# Patient Record
Sex: Male | Born: 1970 | Race: White | Hispanic: No | Marital: Married | Smoking: Never smoker
Health system: Southern US, Community
[De-identification: ages and names within clinical notes are randomized; demographics above are authoritative.]

## PROBLEM LIST (undated history)

## (undated) DIAGNOSIS — K219 Gastro-esophageal reflux disease without esophagitis: Secondary | ICD-10-CM

## (undated) DIAGNOSIS — M5020 Other cervical disc displacement, unspecified cervical region: Secondary | ICD-10-CM

## (undated) DIAGNOSIS — K4020 Bilateral inguinal hernia, without obstruction or gangrene, not specified as recurrent: Secondary | ICD-10-CM

## (undated) DIAGNOSIS — G4730 Sleep apnea, unspecified: Secondary | ICD-10-CM

## (undated) DIAGNOSIS — G4733 Obstructive sleep apnea (adult) (pediatric): Principal | ICD-10-CM

## (undated) DIAGNOSIS — T7840XA Allergy, unspecified, initial encounter: Secondary | ICD-10-CM

## (undated) DIAGNOSIS — M722 Plantar fascial fibromatosis: Secondary | ICD-10-CM

## (undated) HISTORY — DX: Gastro-esophageal reflux disease without esophagitis: K21.9

## (undated) HISTORY — DX: Other cervical disc displacement, unspecified cervical region: M50.20

## (undated) HISTORY — DX: Obstructive sleep apnea (adult) (pediatric): G47.33

## (undated) HISTORY — PX: WISDOM TOOTH EXTRACTION: SHX21

## (undated) HISTORY — DX: Bilateral inguinal hernia, without obstruction or gangrene, not specified as recurrent: K40.20

## (undated) HISTORY — DX: Allergy, unspecified, initial encounter: T78.40XA

## (undated) HISTORY — DX: Plantar fascial fibromatosis: M72.2

## (undated) HISTORY — DX: Sleep apnea, unspecified: G47.30

---

## 2000-09-15 ENCOUNTER — Encounter: Payer: Self-pay | Admitting: Emergency Medicine

## 2000-09-15 ENCOUNTER — Emergency Department (HOSPITAL_COMMUNITY): Admission: EM | Admit: 2000-09-15 | Discharge: 2000-09-15 | Payer: Self-pay | Admitting: Emergency Medicine

## 2002-09-13 HISTORY — PX: VASECTOMY: SHX75

## 2011-05-20 ENCOUNTER — Emergency Department (HOSPITAL_COMMUNITY)
Admission: EM | Admit: 2011-05-20 | Discharge: 2011-05-20 | Disposition: A | Discharge status: Home / Self Care | Payer: BC Managed Care – PPO | Attending: Emergency Medicine | Admitting: Emergency Medicine

## 2011-05-20 ENCOUNTER — Emergency Department (HOSPITAL_COMMUNITY): Payer: BC Managed Care – PPO

## 2011-05-20 DIAGNOSIS — M940 Chondrocostal junction syndrome [Tietze]: Secondary | ICD-10-CM | POA: Insufficient documentation

## 2011-05-20 DIAGNOSIS — R071 Chest pain on breathing: Secondary | ICD-10-CM | POA: Insufficient documentation

## 2011-05-20 LAB — D-DIMER, QUANTITATIVE: D-Dimer, Quant: 0.22 ug/mL-FEU (ref 0.00–0.48)

## 2011-12-05 ENCOUNTER — Ambulatory Visit (INDEPENDENT_AMBULATORY_CARE_PROVIDER_SITE_OTHER): Payer: BC Managed Care – PPO | Admitting: Family Medicine

## 2011-12-05 VITALS — BP 145/72 | HR 68 | Temp 98.1°F | Resp 16 | Ht 74.0 in | Wt 220.0 lb

## 2011-12-05 DIAGNOSIS — J40 Bronchitis, not specified as acute or chronic: Secondary | ICD-10-CM

## 2011-12-05 MED ORDER — PREDNISONE 20 MG PO TABS: ORAL_TABLET | ORAL | Status: AC

## 2011-12-05 MED ORDER — HYDROCODONE-HOMATROPINE 5-1.5 MG/5ML PO SYRP: 5.0000 mL | ORAL_SOLUTION | Freq: Three times a day (TID) | ORAL | Status: AC | PRN

## 2011-12-05 NOTE — Progress Notes (Signed)
  Subjective:    Patient ID: James Burton, male    DOB: 1970/11/26, 41 y.o.   MRN: [id number]  HPI 41 yo male with URI symptoms.  Started with sore throat and head congestion.  Now cough remaining.  Wheezing.  "Sounds junky".  No fevers, slight sob at work (physical job).Cough has turned dry. Tried zyrtec, delsym.  Cough doesn't keep him awake (keeps wife awake)    Review of Systems     Objective:   Physical Exam  Constitutional: He appears well-developed. No distress.  HENT:  Right Ear: Tympanic membrane, external ear and ear canal normal. Tympanic membrane is not injected, not scarred, not perforated, not erythematous, not retracted and not bulging.  Left Ear: Tympanic membrane, external ear and ear canal normal. Tympanic membrane is not injected, not scarred, not perforated, not erythematous, not retracted and not bulging.  Nose: No mucosal edema or rhinorrhea. Right sinus exhibits no maxillary sinus tenderness and no frontal sinus tenderness. Left sinus exhibits no maxillary sinus tenderness and no frontal sinus tenderness.  Mouth/Throat: Uvula is midline, oropharynx is clear and moist and mucous membranes are normal. No oropharyngeal exudate or tonsillar abscesses.  Cardiovascular: Normal rate, regular rhythm, normal heart sounds and intact distal pulses.   No murmur heard. Pulmonary/Chest: Effort normal and breath sounds normal. No respiratory distress. He has no wheezes. He has no rales.  Lymphadenopathy:       Head (right side): No submandibular and no preauricular adenopathy present.       Head (left side): No submandibular and no preauricular adenopathy present.       Right cervical: No superficial cervical and no posterior cervical adenopathy present.      Left cervical: No superficial cervical and no posterior cervical adenopathy present.       Right: No supraclavicular adenopathy present.       Left: No supraclavicular adenopathy present.  Skin: Skin is warm and dry.           Assessment & Plan:  Post-viral bronchitis - prednisone, hycodan

## 2014-02-14 ENCOUNTER — Ambulatory Visit: Payer: BC Managed Care – PPO | Admitting: Family

## 2014-02-22 ENCOUNTER — Ambulatory Visit (INDEPENDENT_AMBULATORY_CARE_PROVIDER_SITE_OTHER): Payer: BC Managed Care – PPO | Admitting: Family

## 2014-02-22 ENCOUNTER — Encounter: Payer: Self-pay | Admitting: Family

## 2014-02-22 VITALS — BP 110/70 | HR 66 | Temp 97.8°F | Resp 12 | Ht 74.0 in | Wt 225.0 lb

## 2014-02-22 DIAGNOSIS — R05 Cough: Secondary | ICD-10-CM

## 2014-02-22 DIAGNOSIS — R002 Palpitations: Secondary | ICD-10-CM

## 2014-02-22 DIAGNOSIS — R059 Cough, unspecified: Secondary | ICD-10-CM

## 2014-02-22 DIAGNOSIS — R0602 Shortness of breath: Secondary | ICD-10-CM

## 2014-02-22 MED ORDER — BUDESONIDE-FORMOTEROL FUMARATE 80-4.5 MCG/ACT IN AERO: 2.0000 | INHALATION_SPRAY | Freq: Two times a day (BID) | RESPIRATORY_TRACT | Status: DC

## 2014-02-22 MED ORDER — ALBUTEROL SULFATE (2.5 MG/3ML) 0.083% IN NEBU
2.5000 mg | INHALATION_SOLUTION | Freq: Once | RESPIRATORY_TRACT | Status: AC
  Administered 2014-02-22: 2.5 mg via RESPIRATORY_TRACT

## 2014-02-22 MED ORDER — FLUTICASONE PROPIONATE 50 MCG/ACT NA SUSP: 2.0000 | Freq: Every day | NASAL | Status: DC

## 2014-02-22 NOTE — Addendum Note (Signed)
Addended by: Kelle Darting A on: 02/22/2014 03:50 PM   Modules accepted: Orders

## 2014-02-22 NOTE — Patient Instructions (Signed)
Start Symbicort (download rx from [email or web address]). Continue zyrtec daily, add flonase (OTC) and generic prilosec (otc) once daily. Follow up in 1 month for complete physical.  Welcome to [facility]!

## 2014-02-22 NOTE — Progress Notes (Signed)
Subjective:    Patient ID: James Burton, male    DOB: September 15, 1970, 43 y.o.   MRN: [id number]  HPI  James Burton is a 43 yr old male who presents today to establish care. His chief complaint today is trouble taking a deep breath x 1 year. Reports that he has had a dry cough for 2 months.  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  He reports that his youngest son has asthma. He has not been diagnosed with asthma. Reports that he sometimes has associated nighttime wheezing.  Also complains of occasional heartburn symptoms.  Occasional nasal drip.  Cough is generally dry.  Reports that he has tried his son's inhaler twice with minimal improvement.  Symptoms lightly worse with exertion.    Reports bilateral inguinal hernias-  Does not cause him pain.  Plantar fasciitis- has had x 2 years. Reports that he has done therapy x 8 months, aleve, massage- almost resolved.   Review of Systems  Constitutional: Negative for fever and unexpected weight change.  Respiratory: Positive for cough.   Cardiovascular: Positive for palpitations. Negative for chest pain.  Gastrointestinal: Negative for nausea, vomiting, diarrhea and constipation.  Musculoskeletal: Negative for arthralgias and myalgias.  Skin: Negative for rash.  Neurological: Negative for headaches.  Hematological: Negative for adenopathy.  Psychiatric/Behavioral:       Denies depression/anxiety   History reviewed. No pertinent past medical history.  History   Social History  . Marital Status: Married    Spouse Name: N/A    Number of Children: N/A  . Years of Education: N/A   Occupational History  . Not on file.   Social History Main Topics  . Smoking status: Never Smoker   . Smokeless tobacco: Never Used  . Alcohol Use: No  . Drug Use: Not on file  . Sexual Activity: Not on file   Other Topics Concern  . Not on file   Social History Narrative   2 boys- ages 40 and 15   Married   Enjoys Environmental health practitioner   Completed 4 yrs of Media planner schooling   Works as Facilities manager    Past Surgical History  Procedure Laterality Date  . Vasectomy  2004    Family History  Problem Relation Age of Onset  . Diabetes Father   . Cancer Maternal Grandfather     bone, skin    Allergies  Allergen Reactions  . Diphenhydramine-Phenylephrine     No current outpatient prescriptions on file prior to visit.   No current facility-administered medications on file prior to visit.    BP 110/70  Pulse 66  Temp(Src) 97.8 F (36.6 C) (Oral)  Resp 12  Ht 6\' 2"  (1.88 m)  Wt 225 lb (102.059 kg)  BMI 28.88 kg/m2  SpO2 99%       Objective:   Physical Exam  Constitutional: He is oriented to person, place, and time. He appears well-developed and well-nourished. No distress.  HENT:  Head: Normocephalic and atraumatic.  Right Ear: Tympanic membrane and ear canal normal.  Left Ear: Tympanic membrane and ear canal normal.  Cardiovascular: Normal rate and regular rhythm.   No murmur heard. Pulmonary/Chest: Effort normal and breath sounds normal. No respiratory distress. He has no wheezes. He has no rales. He exhibits no tenderness.  Musculoskeletal: He exhibits no edema.  Neurological: He is alert and oriented to person, place, and time.  Skin: Skin is warm and dry.  Psychiatric: He has a  normal mood and affect. His behavior is normal. Judgment and thought content normal.          Assessment & Plan:

## 2014-02-22 NOTE — Progress Notes (Signed)
Pre visit review using our clinic review tool, if applicable. No additional management support is needed unless otherwise documented below in the visit note. 

## 2014-02-22 NOTE — Assessment & Plan Note (Addendum)
EKG is performed today and notes NSR without acute changes. Pt notes + dry cough.  I suspect symptoms may be secondary to asthma variant.  PFT's were performed today however and should good pulmonary function both pre and post bronchodilator.  Start Symbicort (download rx from [email or web address]). Continue zyrtec daily, add flonase (OTC) and generic prilosec (otc) once daily. Follow up in 1 month.

## 2014-03-19 ENCOUNTER — Other Ambulatory Visit: Payer: Self-pay | Admitting: Family

## 2014-03-19 ENCOUNTER — Ambulatory Visit (INDEPENDENT_AMBULATORY_CARE_PROVIDER_SITE_OTHER): Payer: BC Managed Care – PPO | Admitting: Family

## 2014-03-19 ENCOUNTER — Encounter: Payer: Self-pay | Admitting: Family

## 2014-03-19 VITALS — BP 120/80 | HR 65 | Temp 98.1°F | Resp 16 | Ht 74.0 in | Wt 228.1 lb

## 2014-03-19 DIAGNOSIS — R059 Cough, unspecified: Secondary | ICD-10-CM

## 2014-03-19 DIAGNOSIS — Z0000 Encounter for general adult medical examination without abnormal findings: Secondary | ICD-10-CM

## 2014-03-19 DIAGNOSIS — R05 Cough: Secondary | ICD-10-CM

## 2014-03-19 LAB — LIPID PANEL
Cholesterol: 176 mg/dL (ref 0–200)
HDL: 32 mg/dL — AB (ref >39)
LDL CALC: 122 mg/dL — AB (ref 0–99)
TRIGLYCERIDES: 109 mg/dL (ref <150)
Total CHOL/HDL Ratio: 5.5 Ratio
VLDL: 22 mg/dL (ref 0–40)

## 2014-03-19 LAB — HEPATIC FUNCTION PANEL
ALK PHOS: 97 U/L (ref 39–117)
ALT: 46 U/L (ref 0–53)
AST: 29 U/L (ref 0–37)
Albumin: 4.8 g/dL (ref 3.5–5.2)
BILIRUBIN DIRECT: 0.1 mg/dL (ref 0.0–0.3)
Indirect Bilirubin: 0.4 mg/dL (ref 0.2–1.2)
Total Bilirubin: 0.5 mg/dL (ref 0.2–1.2)
Total Protein: 6.9 g/dL (ref 6.0–8.3)

## 2014-03-19 LAB — CBC WITH DIFFERENTIAL/PLATELET
BASOS PCT: 0 % (ref 0–1)
Basophils Absolute: 0 10*3/uL (ref 0.0–0.1)
EOS ABS: 0.1 10*3/uL (ref 0.0–0.7)
Eosinophils Relative: 3 % (ref 0–5)
HCT: 42.7 % (ref 39.0–52.0)
Hemoglobin: 15.4 g/dL (ref 13.0–17.0)
Lymphocytes Relative: 18 % (ref 12–46)
Lymphs Abs: 0.8 10*3/uL (ref 0.7–4.0)
MCH: 29.5 pg (ref 26.0–34.0)
MCHC: 36.1 g/dL — ABNORMAL HIGH (ref 30.0–36.0)
MCV: 81.8 fL (ref 78.0–100.0)
MONOS PCT: 11 % (ref 3–12)
Monocytes Absolute: 0.5 10*3/uL (ref 0.1–1.0)
NEUTROS PCT: 68 % (ref 43–77)
Neutro Abs: 3.1 10*3/uL (ref 1.7–7.7)
PLATELETS: 172 10*3/uL (ref 150–400)
RBC: 5.22 MIL/uL (ref 4.22–5.81)
RDW: 12.9 % (ref 11.5–15.5)
WBC: 4.6 10*3/uL (ref 4.0–10.5)

## 2014-03-19 LAB — BASIC METABOLIC PANEL WITH GFR
BUN: 19 mg/dL (ref 6–23)
CALCIUM: 9.5 mg/dL (ref 8.4–10.5)
CO2: 28 mEq/L (ref 19–32)
Chloride: 104 mEq/L (ref 96–112)
Creat: 1.18 mg/dL (ref 0.50–1.35)
GFR, EST AFRICAN AMERICAN: 87 mL/min
GFR, Est Non African American: 75 mL/min
Glucose, Bld: 103 mg/dL — ABNORMAL HIGH (ref 70–99)
Potassium: 4.6 mEq/L (ref 3.5–5.3)
Sodium: 140 mEq/L (ref 135–145)

## 2014-03-19 LAB — TSH: TSH: 1.782 u[IU]/mL (ref 0.350–4.500)

## 2014-03-19 NOTE — Progress Notes (Signed)
Subjective:    Patient ID: James Burton, male    DOB: 03-28-1971, 43 y.o.   MRN: [id number]  HPI  James Burton is a 43 yr old male who presents today for complete physical.  Immunizations: tetanus up to date Diet: reports diet is fair.  Body mass index is 29.28 kg/(m^2). Exercise: no formal exercise Dental: due  Reports that his cough is much improved.   Review of Systems  Constitutional: Negative for unexpected weight change.  HENT: Negative for rhinorrhea.   Eyes: Negative for visual disturbance.  Respiratory: Negative for cough and shortness of breath.   Cardiovascular: Negative for chest pain.  Gastrointestinal: Negative for nausea and vomiting.  Genitourinary: Negative for dysuria and frequency.  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  Psychiatric/Behavioral:       Denies depression or anxiety   Past Medical History  Diagnosis Date  . Bilateral inguinal hernia   . Plantar fasciitis     History   Social History  . Marital Status: Married    Spouse Name: N/A    Number of Children: N/A  . Years of Education: N/A   Occupational History  . Not on file.   Social History Main Topics  . Smoking status: Never Smoker   . Smokeless tobacco: Never Used  . Alcohol Use: No  . Drug Use: Not on file  . Sexual Activity: Not on file   Other Topics Concern  . Not on file   Social History Narrative   2 boys- ages 35 and 8   Married   Enjoys Designer, fashion/clothing   Completed 4 yrs of Media planner schooling   Works as Facilities manager    Past Surgical History  Procedure Laterality Date  . Vasectomy  2004    Family History  Problem Relation Age of Onset  . Diabetes James Burton   . Cancer Maternal Grandfather     bone, skin    Allergies  Allergen Reactions  . Diphenhydramine-Phenylephrine     Rash on palms    Current Outpatient Prescriptions on File Prior to Visit  Medication Sig Dispense Refill  .  budesonide-formoterol (SYMBICORT) 80-4.5 MCG/ACT inhaler Inhale 2 puffs into the lungs 2 (two) times daily.  1 Inhaler  3  . cetirizine (ZYRTEC) 10 MG tablet Take 10 mg by mouth daily as needed for allergies.      . naproxen sodium (ANAPROX) 220 MG tablet Take 220 mg by mouth 2 (two) times daily as needed.      Marland Kitchen omeprazole (PRILOSEC OTC) 20 MG tablet Take 20 mg by mouth daily.       No current facility-administered medications on file prior to visit.    BP 120/80  Pulse 65  Temp(Src) 98.1 F (36.7 C) (Oral)  Resp 16  Ht 6\' 2"  (1.88 m)  Wt 228 lb 1.9 oz (103.475 kg)  BMI 29.28 kg/m2  SpO2 96%       Objective:   Physical Exam Physical Exam  Constitutional: He is oriented to person, place, and time. He appears well-developed and well-nourished. No distress.  HENT:  Head: Normocephalic and atraumatic.  Right Ear: Tympanic membrane and ear canal normal.  Left Ear: Tympanic membrane and ear canal normal.  Mouth/Throat: Oropharynx is clear and moist.  Eyes: Pupils are equal, round, and reactive to light. No scleral icterus.  Neck: Normal range of motion. No thyromegaly present.  Cardiovascular: Normal rate and regular rhythm.  No murmur heard. Pulmonary/Chest: Effort normal and breath sounds normal. No respiratory distress. He has no wheezes. He has no rales. He exhibits no tenderness.  Abdominal: Soft. Bowel sounds are normal. He exhibits no distension and no mass. There is no tenderness. There is no rebound and no guarding.  Musculoskeletal: He exhibits no edema.  Lymphadenopathy:    He has no cervical adenopathy.  Neurological: He is alert and oriented to person, place, and time. He has normal reflexes. He exhibits normal muscle tone. Coordination normal.  Skin: Skin is warm and dry.  Psychiatric: He has a normal mood and affect. His behavior is normal. Judgment and thought content normal.          Assessment & Plan:          Assessment & Plan:

## 2014-03-19 NOTE — Assessment & Plan Note (Signed)
Improved.  He prefers nasonex over flonase.  Continue nasal steroid, PPI, zyrtec, symbicort.

## 2014-03-19 NOTE — Patient Instructions (Signed)
Schedule an eye exam and dental. Try to 30 minutes of cardio 5 days a week. Limit sodas. Complete lab work prior to leaving.  Please schedule a follow up appointment in 6 months.

## 2014-03-19 NOTE — Assessment & Plan Note (Signed)
Discussed healthy diet, exercise.  Obtain fasting lab work. Advised pt to arrange dental and eye exam.

## 2014-03-19 NOTE — Progress Notes (Signed)
Pre visit review using our clinic review tool, if applicable. No additional management support is needed unless otherwise documented below in the visit note. 

## 2014-03-20 ENCOUNTER — Encounter: Payer: Self-pay | Admitting: Family

## 2014-03-20 LAB — URINALYSIS, ROUTINE W REFLEX MICROSCOPIC
BILIRUBIN URINE: NEGATIVE
Glucose, UA: NEGATIVE mg/dL
Hgb urine dipstick: NEGATIVE
KETONES UR: NEGATIVE mg/dL
Leukocytes, UA: NEGATIVE
Nitrite: NEGATIVE
PROTEIN: NEGATIVE mg/dL
Specific Gravity, Urine: 1.024 (ref 1.005–1.030)
UROBILINOGEN UA: 0.2 mg/dL (ref 0.0–1.0)
pH: 5.5 (ref 5.0–8.0)

## 2014-03-20 LAB — URINALYSIS, MICROSCOPIC ONLY
BACTERIA UA: NONE SEEN
Casts: NONE SEEN
Crystals: NONE SEEN
Squamous Epithelial / LPF: NONE SEEN

## 2014-03-21 LAB — HEMOGLOBIN A1C
Hgb A1c MFr Bld: 5.4 % (ref <5.7)
MEAN PLASMA GLUCOSE: 108 mg/dL (ref <117)

## 2014-03-22 ENCOUNTER — Encounter: Payer: Self-pay | Admitting: Family

## 2014-06-28 ENCOUNTER — Other Ambulatory Visit: Payer: Self-pay

## 2014-09-25 ENCOUNTER — Ambulatory Visit (INDEPENDENT_AMBULATORY_CARE_PROVIDER_SITE_OTHER): Payer: BLUE CROSS/BLUE SHIELD | Admitting: Family

## 2014-09-25 ENCOUNTER — Encounter: Payer: Self-pay | Admitting: Family

## 2014-09-25 VITALS — BP 90/56 | HR 86 | Temp 97.9°F | Resp 16 | Ht 74.0 in | Wt 231.0 lb

## 2014-09-25 DIAGNOSIS — Z23 Encounter for immunization: Secondary | ICD-10-CM

## 2014-09-25 DIAGNOSIS — R0683 Snoring: Secondary | ICD-10-CM

## 2014-09-25 DIAGNOSIS — G4733 Obstructive sleep apnea (adult) (pediatric): Secondary | ICD-10-CM | POA: Insufficient documentation

## 2014-09-25 DIAGNOSIS — R05 Cough: Secondary | ICD-10-CM

## 2014-09-25 DIAGNOSIS — R059 Cough, unspecified: Secondary | ICD-10-CM

## 2014-09-25 HISTORY — DX: Obstructive sleep apnea (adult) (pediatric): G47.33

## 2014-09-25 NOTE — Assessment & Plan Note (Signed)
Improved, ok off meds.  Monitor.

## 2014-09-25 NOTE — Patient Instructions (Addendum)
You will be contacted about your home sleep study. Follow up after 03/20/15 for annual physical.

## 2014-09-25 NOTE — Progress Notes (Signed)
   Subjective:    Patient ID: James Burton, male    DOB: [birth year], 44 y.o.   MRN: [id number]  HPI  James Burton is a 44 yr old male who presents today for follow up.  He was last seen 6 months ago and complained of cough. He was started on symbicort back in June, continued on zyrtec and flonase was added.  He was also instructed to start generic prilosec.  He reports that his cough symptoms have resolved. He reports that he has stopped all meds and reports that he only coughs infrequently.   + snoring with witnessed apneas.  This is worse when he is congested.  Reports that he "tosses and turns" all night long.  Never dozes off unexpectedly.      Review of Systems See HPI  Past Medical History  Diagnosis Date  . Bilateral inguinal hernia   . Plantar fasciitis     History   Social History  . Marital Status: Married    Spouse Name: N/A    Number of Children: N/A  . Years of Education: N/A   Occupational History  . Not on file.   Social History Main Topics  . Smoking status: Never Smoker   . Smokeless tobacco: Never Used  . Alcohol Use: No  . Drug Use: Not on file  . Sexual Activity: Not on file   Other Topics Concern  . Not on file   Social History Narrative   2 boys- ages 70 and 10   Married   Enjoys Designer, fashion/clothing   Completed 4 yrs of Media planner schooling   Works as Facilities manager    Past Surgical History  Procedure Laterality Date  . Vasectomy  2004    Family History  Problem Relation Age of Onset  . Diabetes Father   . Cancer Maternal Grandfather     bone, skin    Allergies  Allergen Reactions  . Diphenhydramine-Phenylephrine     Rash on palms    No current outpatient prescriptions on file prior to visit.   No current facility-administered medications on file prior to visit.    Ht 6\' 2"  (1.88 m)  Wt 231 lb (104.781 kg)  BMI 29.65 kg/m2       Objective:   Physical Exam  Constitutional: He is oriented to person, place, and time. He  appears well-developed and well-nourished. No distress.  HENT:  Head: Normocephalic and atraumatic.  Mouth/Throat: No posterior oropharyngeal edema or posterior oropharyngeal erythema.  Cardiovascular: Normal rate and regular rhythm.   No murmur heard. Pulmonary/Chest: Effort normal and breath sounds normal. No respiratory distress. He has no wheezes. He has no rales. He exhibits no tenderness.  Neurological: He is alert and oriented to person, place, and time.  Psychiatric: He has a normal mood and affect. His behavior is normal. Judgment and thought content normal.          Assessment & Plan:

## 2014-09-25 NOTE — Assessment & Plan Note (Signed)
+   snoring, + witnessed apneas. Refer for sleep study.

## 2014-09-25 NOTE — Progress Notes (Signed)
Pre visit review using our clinic review tool, if applicable. No additional management support is needed unless otherwise documented below in the visit note. 

## 2014-11-11 DIAGNOSIS — G4733 Obstructive sleep apnea (adult) (pediatric): Secondary | ICD-10-CM

## 2014-11-14 DIAGNOSIS — G4733 Obstructive sleep apnea (adult) (pediatric): Secondary | ICD-10-CM

## 2014-11-15 ENCOUNTER — Other Ambulatory Visit: Payer: Self-pay | Admitting: *Deleted

## 2014-11-15 DIAGNOSIS — R0683 Snoring: Secondary | ICD-10-CM

## 2014-11-17 ENCOUNTER — Encounter: Payer: Self-pay | Admitting: Family

## 2014-11-17 ENCOUNTER — Telehealth: Payer: Self-pay | Admitting: Family

## 2014-11-17 DIAGNOSIS — G4733 Obstructive sleep apnea (adult) (pediatric): Secondary | ICD-10-CM

## 2014-11-17 NOTE — Telephone Encounter (Signed)
Please contact pt and let him know that his home sleep study shows moderate sleep apnea. I would like to arrange home cpap for him.  He should follow up in in the office 6 weeks after starting CPAP.

## 2014-11-18 NOTE — Telephone Encounter (Signed)
Notified pt. He voices understanding and is agreeable to proceed with CPAP.

## 2015-03-06 ENCOUNTER — Telehealth: Payer: Self-pay | Admitting: Family

## 2015-03-06 NOTE — Telephone Encounter (Signed)
Pre visit letter mailed

## 2015-03-07 ENCOUNTER — Encounter: Payer: Self-pay | Admitting: Physician Assistant

## 2015-03-07 ENCOUNTER — Encounter: Payer: Self-pay | Admitting: Family

## 2015-03-07 ENCOUNTER — Ambulatory Visit (INDEPENDENT_AMBULATORY_CARE_PROVIDER_SITE_OTHER): Payer: BLUE CROSS/BLUE SHIELD | Admitting: Physician Assistant

## 2015-03-07 VITALS — BP 121/73 | HR 57 | Temp 97.9°F | Ht 74.0 in | Wt 227.0 lb

## 2015-03-07 DIAGNOSIS — M542 Cervicalgia: Secondary | ICD-10-CM | POA: Insufficient documentation

## 2015-03-07 DIAGNOSIS — S161XXA Strain of muscle, fascia and tendon at neck level, initial encounter: Secondary | ICD-10-CM

## 2015-03-07 MED ORDER — METHOCARBAMOL 500 MG PO TABS: 500.0000 mg | ORAL_TABLET | Freq: Four times a day (QID) | ORAL | Status: DC

## 2015-03-07 MED ORDER — MELOXICAM 15 MG PO TABS: 15.0000 mg | ORAL_TABLET | Freq: Every day | ORAL | Status: DC

## 2015-03-07 NOTE — Progress Notes (Signed)
Pre visit review using our clinic review tool, if applicable. No additional management support is needed unless otherwise documented below in the visit note. 

## 2015-03-07 NOTE — Assessment & Plan Note (Signed)
Rx Mobic once daily. ROoaxin TID. No driving while on medication.  Supportive measures and good posture reviewed with patient.  Follow-up PRN if symptoms are not improving.

## 2015-03-07 NOTE — Progress Notes (Signed)
   Patient presents to clinic today c/o neck pain and soreness over the past week.  Denies trauma or injury.  Has previous history of muscle spasm.  Has been taking Advil with minimal relief.  Ice has helped.  Past Medical History  Diagnosis Date  . Bilateral inguinal hernia   . Plantar fasciitis   . OSA (obstructive sleep apnea) 09/25/2014    Moderate OSA per home study 3/16     No current outpatient prescriptions on file prior to visit.   No current facility-administered medications on file prior to visit.    Allergies  Allergen Reactions  . Diphenhydramine-Phenylephrine     Rash on palms    Family History  Problem Relation Age of Onset  . Diabetes Father   . Cancer Maternal Grandfather     bone, skin    History   Social History  . Marital Status: Married    Spouse Name: N/A  . Number of Children: N/A  . Years of Education: N/A   Social History Main Topics  . Smoking status: Never Smoker   . Smokeless tobacco: Never Used  . Alcohol Use: No  . Drug Use: Not on file  . Sexual Activity: Not on file   Other Topics Concern  . None   Social History Narrative   2 boys- ages 76 and 29   Married   Enjoys Designer, fashion/clothing   Completed 4 yrs of Media planner schooling   Works as Facilities manager   Review of Systems - See HPI.  All other ROS are negative.  BP 121/73 mmHg  Pulse 57  Temp(Src) 97.9 F (36.6 C) (Oral)  Ht 6\' 2"  (1.88 m)  Wt 227 lb (102.967 kg)  BMI 29.13 kg/m2  SpO2 100%  Physical Exam  Constitutional: He is oriented to person, place, and time and well-developed, well-nourished, and in no distress.  HENT:  Head: Normocephalic and atraumatic.  Neck: Normal range of motion. Neck supple. Muscular tenderness present. No spinous process tenderness present.  Cardiovascular: Normal rate, regular rhythm, normal heart sounds and intact distal pulses.   Pulmonary/Chest: Effort normal and breath sounds normal.  Neurological: He is alert and oriented to person,  place, and time.  Skin: Skin is warm and dry. No rash noted.  Psychiatric: Affect normal.  Vitals reviewed.  Assessment/Plan: Neck muscle strain Rx Mobic once daily. ROoaxin TID. No driving while on medication.  Supportive measures and good posture reviewed with patient.  Follow-up PRN if symptoms are not improving.

## 2015-03-07 NOTE — Patient Instructions (Signed)
Please take the Meloxicam daily as directed.  Use Tylenol for breakthrough pain. Take the Robaxin up to three times daily. Do not drive while on medication. Alternate ice and heat to the area. Avoid heavy lifting or overexertion. Follow-up if symptoms are not resolving.

## 2015-03-26 ENCOUNTER — Encounter: Payer: BLUE CROSS/BLUE SHIELD | Admitting: Family

## 2015-04-11 ENCOUNTER — Ambulatory Visit (INDEPENDENT_AMBULATORY_CARE_PROVIDER_SITE_OTHER): Payer: BLUE CROSS/BLUE SHIELD | Admitting: Family

## 2015-04-11 ENCOUNTER — Encounter: Payer: Self-pay | Admitting: Family

## 2015-04-11 VITALS — BP 122/74 | HR 56 | Temp 97.8°F | Resp 16 | Ht 74.0 in | Wt 225.0 lb

## 2015-04-11 DIAGNOSIS — M542 Cervicalgia: Secondary | ICD-10-CM

## 2015-04-11 MED ORDER — CYCLOBENZAPRINE HCL 5 MG PO TABS: 5.0000 mg | ORAL_TABLET | Freq: Three times a day (TID) | ORAL | Status: DC | PRN

## 2015-04-11 MED ORDER — METHYLPREDNISOLONE 4 MG PO TBPK: ORAL_TABLET | ORAL | Status: DC
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
## 2015-04-11 NOTE — Assessment & Plan Note (Signed)
Now with radiculopathy. Advised course of medrol dose pak, flexeril prn, MRI to further evaluate.

## 2015-04-11 NOTE — Patient Instructions (Signed)
You will be contacted about your MRI. Start medrol dose pak. You may use flexeril as needed. Let me know if your symptoms worsen or if not improved in 1 week.

## 2015-04-11 NOTE — Progress Notes (Signed)
Pre visit review using our clinic review tool, if applicable. No additional management support is needed unless otherwise documented below in the visit note. 

## 2015-04-12 ENCOUNTER — Ambulatory Visit (HOSPITAL_BASED_OUTPATIENT_CLINIC_OR_DEPARTMENT_OTHER)
Admission: RE | Admit: 2015-04-12 | Discharge: 2015-04-12 | Disposition: A | Discharge status: Home / Self Care | Payer: BLUE CROSS/BLUE SHIELD | Source: Ambulatory Visit | Attending: Family | Admitting: Family

## 2015-04-12 DIAGNOSIS — M542 Cervicalgia: Secondary | ICD-10-CM

## 2015-04-14 ENCOUNTER — Telehealth: Payer: Self-pay | Admitting: Family

## 2015-04-14 MED ORDER — TRAMADOL HCL 50 MG PO TABS: 50.0000 mg | ORAL_TABLET | Freq: Three times a day (TID) | ORAL | Status: DC | PRN

## 2015-04-14 NOTE — Telephone Encounter (Signed)
See rx for tramadol prn.  I would advise him to give the steroids a few more days. If his symptoms improve enough that he can reschedule MRI that would be be great. He can take muscle relaxer and tramadol prior to MRI.

## 2015-04-14 NOTE — Telephone Encounter (Signed)
Caller name: [name] Relationship to patient: self Can be reached: [phone] Pharmacy:  Reason for call: Pt went for MRI on Saturday. He was not able to remain flat and still to complete the MRI due to pain with his neck. They cancelled the MRI and advised him to contact PCP. He said that his neck pain is much worse when laying down and he is having trouble sleeping. Pt has a muscle relaxer and states it helps but he is still in a good deal of pain.

## 2015-04-14 NOTE — Telephone Encounter (Signed)
Notified pt. He voices understanding. Will try to schedule MRI as soon as his pain will allow. Advised him to let us know if pain is not improving or is worsening.

## 2015-04-15 ENCOUNTER — Telehealth: Payer: Self-pay | Admitting: Family

## 2015-04-15 MED ORDER — DIAZEPAM 5 MG PO TABS: ORAL_TABLET | ORAL | Status: DC

## 2015-04-15 NOTE — Telephone Encounter (Signed)
See prev phone note

## 2015-04-15 NOTE — Addendum Note (Signed)
Addended by: [name] on: 04/15/2015 12:11 PM   Modules accepted: Orders

## 2015-04-15 NOTE — Addendum Note (Signed)
Addended by: Kelle Darting A on: 04/15/2015 01:44 PM   Modules accepted: Orders

## 2015-04-15 NOTE — Telephone Encounter (Signed)
Please see rx pended below. I would advise that he have someone drive him home from MRI due to possible sedation from valium.

## 2015-04-15 NOTE — Telephone Encounter (Signed)
Rx called to [facility] at [facility] and pt has been notified.

## 2015-04-15 NOTE — Telephone Encounter (Signed)
Can be reached: [phone] Pharmacy: [facility] on [address]  Reason for call: Pt called and states he rescheduled MRI for 04/19/15. He said they advised him to contact us for valium to take before to get thru MRI this time.

## 2015-04-19 ENCOUNTER — Ambulatory Visit (HOSPITAL_BASED_OUTPATIENT_CLINIC_OR_DEPARTMENT_OTHER)
Admission: RE | Admit: 2015-04-19 | Discharge: 2015-04-19 | Disposition: A | Discharge status: Home / Self Care | Payer: BLUE CROSS/BLUE SHIELD | Source: Ambulatory Visit | Attending: Family | Admitting: Family

## 2015-04-19 DIAGNOSIS — M5022 Other cervical disc displacement, mid-cervical region: Secondary | ICD-10-CM | POA: Diagnosis not present

## 2015-04-19 DIAGNOSIS — G542 Cervical root disorders, not elsewhere classified: Secondary | ICD-10-CM | POA: Diagnosis not present

## 2015-04-19 DIAGNOSIS — M542 Cervicalgia: Secondary | ICD-10-CM | POA: Diagnosis present

## 2015-04-20 ENCOUNTER — Telehealth: Payer: Self-pay | Admitting: Family

## 2015-04-20 DIAGNOSIS — M5010 Cervical disc disorder with radiculopathy, unspecified cervical region: Secondary | ICD-10-CM

## 2015-04-20 MED ORDER — METHYLPREDNISOLONE 4 MG PO TBPK: ORAL_TABLET | ORAL | Status: DC

## 2015-04-20 NOTE — Telephone Encounter (Signed)
Reviewed MRI report. Contacted pt.  Left detailed message on pt's cell number advising him that I will arrange a neurosurgery consult and to repeat medrol dose pak.    Jen/Marj- could you please contact neuro surgery and let them know that I would like for them to him ASAP- let me know if you are not able to get pt in on Monday or Tuesday to be seen.

## 2015-04-21 NOTE — Telephone Encounter (Signed)
Records faxed over for review with MD on call/awaiting appt

## 2015-04-21 NOTE — Telephone Encounter (Signed)
Thank you :)

## 2015-04-21 NOTE — Telephone Encounter (Signed)
Pt is seeing Dr Joya Salm today @ 3

## 2015-04-24 ENCOUNTER — Other Ambulatory Visit: Payer: Self-pay | Admitting: Neurosurgery

## 2015-04-24 DIAGNOSIS — M5412 Radiculopathy, cervical region: Secondary | ICD-10-CM

## 2015-04-28 ENCOUNTER — Encounter: Payer: Self-pay | Admitting: Physical Therapy

## 2015-04-28 ENCOUNTER — Ambulatory Visit: Payer: BLUE CROSS/BLUE SHIELD | Attending: Neurosurgery | Admitting: Physical Therapy

## 2015-04-28 DIAGNOSIS — M792 Neuralgia and neuritis, unspecified: Secondary | ICD-10-CM | POA: Diagnosis present

## 2015-04-28 DIAGNOSIS — M542 Cervicalgia: Secondary | ICD-10-CM | POA: Diagnosis not present

## 2015-04-28 DIAGNOSIS — M436 Torticollis: Secondary | ICD-10-CM | POA: Insufficient documentation

## 2015-04-28 NOTE — Therapy (Signed)
[facility] Outpatient Rehabilitation [facility] [address]  [address] [place], [place], [address] Phone: [phone]   Fax:  [phone]  Physical Therapy Evaluation  Patient Details  Name: James Burton MRN: [id number] Date of Birth: [birth year] Referring Provider:  Hilda Lias, MD  Encounter Date: 04/28/2015      PT End of Session - 04/28/15 1538    Visit Number 1   Number of Visits 12   Date for PT Re-Evaluation 05/23/15   PT Start Time 1528   PT Stop Time 1637   PT Time Calculation (min) 69 min      Past Medical History  Diagnosis Date  . Bilateral inguinal hernia   . Plantar fasciitis   . OSA (obstructive sleep apnea) 09/25/2014    Moderate OSA per home study 3/16     Past Surgical History  Procedure Laterality Date  . Vasectomy  2004    There were no vitals filed for this visit.  Visit Diagnosis:  Neck pain  Radicular pain in left arm  Neck stiffness      Subjective Assessment - 04/28/15 1539    Subjective Pt with intense neck pain over the past 4-5 months.  He participated a couple chiropractic treatments but didn't seem to help.  Recent imaging reveals large disc bulges which include cord flattening.  Pt reports intermittent N/T in posterior L UE but states this has diminished lately and is currently only noted in L digits #1-2.  States most difficulty is with sleeping at night.  States notes L UE pain when attempting to sleep.   Currently in Pain? Yes   Pain Score --  pt rates AVG pain 3/10 lately and worst pain 7/10 in the past few days.   Pain Location Neck   Pain Orientation Left   Pain Descriptors / Indicators Aching;Sharp  pain increases to sharp with coughing, sneezing, etc   Pain Radiating Towards pain extends L neck and throughout L scapula and into L UE.   Pain Onset More than a month ago   Pain Frequency Intermittent   Aggravating Factors  prolonged static sitting, neck motion (especially looking up), reaching  overhead   Pain Relieving Factors ice, medication            [facility] PT Assessment - 04/28/15 0001    Assessment   Medical Diagnosis neck pain   Onset Date/Surgical Date 12/13/14   Hand Dominance Right   Balance Screen   Has the patient fallen in the past 6 months No   Has the patient had a decrease in activity level because of a fear of falling?  No   Is the patient reluctant to leave their home because of a fear of falling?  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   Leisure walks some for exercise, no other regular exercise (work is typically very physical)   Observation/Other Assessments   Focus on Therapeutic Outcomes (FOTO)  54% limitation   ROM / Strength   AROM / PROM / Strength AROM;Strength   AROM   AROM Assessment Site Cervical   Cervical Flexion WNL - mild pain   Cervical Extension 30  posterior neck and L UE pain   Cervical - Right Side Bend 27  tight without pain   Cervical - Left Side Bend 30  tight without pain   Cervical - Right  Rotation 48  L neck pain, L UE n/t   Cervical - Left Rotation 60  mild pull, no pain   Strength   Strength Assessment Site Hand   Right/Left hand Right;Left   Right Hand Grip (lbs) 1:53# , 2:122#, 3:120#, 4: 104#, 5:94#   Right Hand Lateral Pinch 21 lbs   Right Hand 3 Point Pinch 13 lbs   Left Hand Grip (lbs) 1:67# , 2:110#, 3:120# , 4: 105#, 5:87#   Left Hand Lateral Pinch 21 lbs   Left Hand 3 Point Pinch 10 lbs       TODAY'S TREATMENT Manual - t-spine PA mobes mid to upper t-spine grade 3.  Began to note L UE radicular pain with upper t-spine PA mobes so changed to manual distraction TherAct - instructed and practiced supine lying with towel roll in pillow case for proper neck support with sleeping (states felt good without radicular pain) Mechanical Traction - c-spine, 20dg pull, 10#/5#, 60"/20",  15'                   PT Education - 04/28/15 1630    Education provided Yes   Education Details posture correction, sleeping posture with towel roll in pillow case.   Person(s) Educated Patient   Methods Explanation;Demonstration   Comprehension Verbalized understanding;Returned demonstration             PT Long Term Goals - 04/28/15 1631    PT LONG TERM GOAL #1   Title pt independent with HEP as necessary for continued progress by 05/23/15   Status New   PT LONG TERM GOAL #2   Title pt reports neck pain no greater than 4/10 at worst and 2/10 on average by 05/23/15   Status New   PT LONG TERM GOAL #3   Title pt displays more upright posture (ear nearly over shoulders and shoulders over hips) by 05/23/15   Status New   PT LONG TERM GOAL #4   Title pt states able to sleep without limitation by neck pain by 05/23/15   Status New   PT LONG TERM GOAL #5   Title c-spine B rotation AROM to 60 dg or better by 05/23/15               Plan - 04/28/15 1623    Clinical Impression Statement Pt with neck pain, LOM, and L UE radicular pain along with N/T.  MRI indicates significant disc issues at C5/6 (right and central) and C6/7 (left and central).  Pt unknown MOI but with significant forward head and rounded shoulder posture.  He states he's been working on improving his posture lately and that will also be part of our POC here.  There is hypomobility noted in upper t-spine.  B grip strength equal at 120#.  Pt notes decreased pain with use of ice and since taking prednisone dose pack.  He is scheduled for epidural so am optimistic for further improvement.  Treatments here will focus on listed impairments along with mechanical traction and mild extension biased neck program.   Pt will benefit from skilled therapeutic intervention in order to improve on the following deficits Pain;Decreased mobility;Decreased range of motion;Hypomobility   Rehab Potential Good   PT Frequency 3x /  week  2-3x/wk   PT Duration 4 weeks   PT Treatment/Interventions Manual techniques;Therapeutic exercise;Therapeutic activities;Traction;Cryotherapy;Patient/family education;Taping;Dry needling   PT Next Visit Plan scapular retraction, pec stretch, mechanical traction (12# next treatment), manual for c-spine extension and t-spine mobility as  tolerated, UE nerve glides   Consulted and Agree with Plan of Care Patient         Problem List Patient Active Problem List   Diagnosis Date Noted  . Cervical pain (neck) 03/07/2015  . OSA (obstructive sleep apnea) 09/25/2014  . Routine general medical examination at a health care facility 03/19/2014  . Cough 02/22/2014    Chinmay Squier PT, OCS 04/28/2015, 4:42 PM  [facility] [address]  [address] [place], [place], [address] Phone: [phone]   Fax:  [phone]

## 2015-04-30 ENCOUNTER — Ambulatory Visit
Admission: RE | Admit: 2015-04-30 | Discharge: 2015-04-30 | Disposition: A | Discharge status: Home / Self Care | Payer: BLUE CROSS/BLUE SHIELD | Source: Ambulatory Visit | Attending: Neurosurgery | Admitting: Neurosurgery

## 2015-04-30 ENCOUNTER — Ambulatory Visit: Payer: BLUE CROSS/BLUE SHIELD | Admitting: Physical Therapy

## 2015-04-30 VITALS — BP 151/90 | HR 69

## 2015-04-30 DIAGNOSIS — M436 Torticollis: Secondary | ICD-10-CM

## 2015-04-30 DIAGNOSIS — M542 Cervicalgia: Secondary | ICD-10-CM

## 2015-04-30 DIAGNOSIS — M792 Neuralgia and neuritis, unspecified: Secondary | ICD-10-CM

## 2015-04-30 DIAGNOSIS — M5412 Radiculopathy, cervical region: Secondary | ICD-10-CM

## 2015-04-30 MED ORDER — DIAZEPAM 5 MG PO TABS
10.0000 mg | ORAL_TABLET | Freq: Once | ORAL | Status: AC
  Administered 2015-04-30: 10 mg via ORAL

## 2015-04-30 MED ORDER — TRIAMCINOLONE ACETONIDE 40 MG/ML IJ SUSP (RADIOLOGY)
60.0000 mg | Freq: Once | INTRAMUSCULAR | Status: AC
  Administered 2015-04-30: 60 mg via EPIDURAL

## 2015-04-30 MED ORDER — IOHEXOL 180 MG/ML  SOLN
1.0000 mL | Freq: Once | INTRAMUSCULAR | Status: DC | PRN
  Administered 2015-04-30: 1 mL via EPIDURAL

## 2015-04-30 NOTE — Discharge Instructions (Signed)

## 2015-04-30 NOTE — Therapy (Signed)
[facility] Outpatient Rehabilitation [facility] [address]  [address] [place], [place], [address] Phone: [phone]   Fax:  [phone]  Physical Therapy Treatment  Patient Details  Name: James Burton MRN: [id number] Date of Birth: Jun 29, 1971 Referring Provider:  Hilda Lias, MD  Encounter Date: 04/30/2015      PT End of Session - 04/30/15 0720    Visit Number 2   Number of Visits 12   Date for PT Re-Evaluation 05/23/15   PT Start Time 0715   PT Stop Time 0813   PT Time Calculation (min) 58 min      Past Medical History  Diagnosis Date  . Bilateral inguinal hernia   . Plantar fasciitis   . OSA (obstructive sleep apnea) 09/25/2014    Moderate OSA per home study 3/16     Past Surgical History  Procedure Laterality Date  . Vasectomy  2004    There were no vitals filed for this visit.  Visit Diagnosis:  Neck pain  Radicular pain in left arm  Neck stiffness      Subjective Assessment - 04/30/15 0717    Subjective pt to undergo c-spine epidural later today.  States did use towel roll in pillow case with benefit.  States is feeling pretty good so far this AM.   Currently in Pain? Yes   Pain Score 1    Pain Location Neck           TODAY'S TREATMENT TherEx - UBE lvl 2.0 1'/1' Foam Roll T-spine Extension rolling 60" Hooklying on Foam Roll - t-spine extension / Pec stretch 2'; Pullover 8# 15x, B ER with scap retraction Green TB 15x Seated c-spine Extension stretch/self mobe with hands stabilizing upper c-spine 10x5" (Added to HEP) Corner Pec Stretch with chin tuck 3x20" (Added to HEP) Corner Push out with chin tuck 10x5" (Added to HEP) Standing "W" with Green TB 15x (Added to HEP)  Mechanical Traction - c-spine, 20dg pull, 13#/7#, 60"/20", 15'            PT Education - 04/30/15 0755    Education provided Yes   Education Details HEP   Person(s) Educated Patient   Methods Explanation;Demonstration;Handout   Comprehension Verbalized understanding;Returned demonstration             PT Long Term Goals - 04/30/15 0757    PT LONG TERM GOAL #1   Title pt independent with HEP as necessary for continued progress by 05/23/15   Status On-going   PT LONG TERM GOAL #2   Title pt reports neck pain no greater than 4/10 at worst and 2/10 on average by 05/23/15   Status On-going   PT LONG TERM GOAL #3   Title pt displays more upright posture (ear nearly over shoulders and shoulders over hips) by 05/23/15   Status On-going   PT LONG TERM GOAL #4   Title pt states able to sleep without limitation by neck pain by 05/23/15   Status On-going               Plan - 04/30/15 0757    Clinical Impression Statement pt with some improvement in sleep with use of towel roll; he is working hard to improve posture; highly motivated.  Pt to undergo c-spine epidural later today so advised him to not perform HEP tomorrow.  Increased traction today.   PT Next Visit Plan scapular retraction, pec stretch, mechanical traction, manual for c-spine extension and t-spine mobility as tolerated, UE  nerve glides   Consulted and Agree with Plan of Care Patient        Problem List Patient Active Problem List   Diagnosis Date Noted  . Cervical pain (neck) 03/07/2015  . OSA (obstructive sleep apnea) 09/25/2014  . Routine general medical examination at a health care facility 03/19/2014  . Cough 02/22/2014    Maclovia Uher PT, OCS 04/30/2015, 8:11 AM  [facility] [address]  [address] [place], [place], [address] Phone: [phone]   Fax:  [phone]

## 2015-05-05 ENCOUNTER — Ambulatory Visit: Payer: BLUE CROSS/BLUE SHIELD | Admitting: Physical Therapy

## 2015-05-05 DIAGNOSIS — M542 Cervicalgia: Secondary | ICD-10-CM | POA: Diagnosis not present

## 2015-05-05 DIAGNOSIS — M792 Neuralgia and neuritis, unspecified: Secondary | ICD-10-CM

## 2015-05-05 DIAGNOSIS — M436 Torticollis: Secondary | ICD-10-CM

## 2015-05-05 NOTE — Therapy (Signed)
[facility] [address]  [address] [place], [place], [address] Phone: [phone]   Fax:  [phone]  Physical Therapy Treatment  Patient Details  Name: James Burton MRN: [id number] Date of Birth: [birth year] Referring Provider:  Hilda Lias, MD  Encounter Date: 05/05/2015      PT End of Session - 05/05/15 0721    Visit Number 3   Number of Visits 12   Date for PT Re-Evaluation 05/23/15   PT Start Time 0716   PT Stop Time 0801   PT Time Calculation (min) 45 min      Past Medical History  Diagnosis Date  . Bilateral inguinal hernia   . Plantar fasciitis   . OSA (obstructive sleep apnea) 09/25/2014    Moderate OSA per home study 3/16     Past Surgical History  Procedure Laterality Date  . Vasectomy  2004    There were no vitals filed for this visit.  Visit Diagnosis:  Neck pain  Radicular pain in left arm  Neck stiffness      Subjective Assessment - 05/05/15 0717    Subjective pt states underwent epidural on 04/30/15 and states has been pain-free since 05/01/15.  He states he continues to note n/t in L UE (elbow to #1-2 fingers) but states is less intense. States has been performing HEP.   Currently in Pain? No/denies      TODAY'S TREATMENT TherEx - UBE lvl 3.0 90"/90" Foam Roll T-spine Extension rolling 60" Hooklying on Foam Roll - t-spine extension / Pec stretch 2'; Pullover 10# 15x, B ER with scap retraction Blue TB 15x, UE Flex with Contra Ext Blue TB 10x each Low Row 45# 15x, 55# 15x Corner Pec Stretch with chin tuck 2x20" High Row 35# 15x Standing B Shoulder ABD 3# 15x  Mechanical Traction - C-spine, 20dg pull, 15#/8#, 60"/20", 15'           PT Long Term Goals - 05/05/15 1027    PT LONG TERM GOAL #1   Title pt independent with HEP as necessary for continued progress by 05/23/15   Status On-going   PT LONG TERM GOAL #2   Title pt reports neck pain no greater than 4/10 at worst and  2/10 on average by 05/23/15   Status On-going   PT LONG TERM GOAL #3   Title pt displays more upright posture (ear nearly over shoulders and shoulders over hips) by 05/23/15   Status On-going   PT LONG TERM GOAL #4   Title pt states able to sleep without limitation by neck pain by 05/23/15   Status On-going   PT LONG TERM GOAL #5   Title c-spine B rotation AROM to 60 dg or better by 05/23/15   Status On-going               Plan - 05/05/15 0746    Clinical Impression Statement pt had been making good progress last week and then received epidural after last PT treatment and has been pain-free ever since.  Continues to note L UE n/t but this too is improving.  Tolerating progressions to treatment very well, may be able to meet goals ahead of POC.  He states he is sleeping better with use of towel roll but states neck feels stiff upon waking in AM.   PT Next Visit Plan assess c-spine AROM and UE ULTT possible Manual if indicated; continue scapular retraction, pec stretch, mechanical traction   Consulted  and Agree with Plan of Care Patient        Problem List Patient Active Problem List   Diagnosis Date Noted  . Cervical pain (neck) 03/07/2015  . OSA (obstructive sleep apnea) 09/25/2014  . Routine general medical examination at a health care facility 03/19/2014  . Cough 02/22/2014    Kailen Hinkle PT, OCS 05/05/2015, 8:48 AM  [facility] [address]  [address] [place], [place], [address] Phone: [phone]   Fax:  [phone]

## 2015-05-06 ENCOUNTER — Ambulatory Visit: Payer: BLUE CROSS/BLUE SHIELD | Admitting: Physical Therapy

## 2015-05-06 DIAGNOSIS — M792 Neuralgia and neuritis, unspecified: Secondary | ICD-10-CM

## 2015-05-06 DIAGNOSIS — M436 Torticollis: Secondary | ICD-10-CM

## 2015-05-06 DIAGNOSIS — M542 Cervicalgia: Secondary | ICD-10-CM | POA: Diagnosis not present

## 2015-05-06 NOTE — Therapy (Signed)
[facility] Outpatient Rehabilitation [facility] [address]  [address] [place], [place], [address] Phone: [phone]   Fax:  [phone]  Physical Therapy Treatment  Patient Details  Name: James Burton MRN: [id number] Date of Birth: 05-07-1971 Referring Provider:  Hilda Lias, MD  Encounter Date: 05/06/2015      PT End of Session - 05/06/15 0805    Visit Number 4   Number of Visits 12   Date for PT Re-Evaluation 05/23/15   PT Start Time 0804   PT Stop Time 0847   PT Time Calculation (min) 43 min      Past Medical History  Diagnosis Date  . Bilateral inguinal hernia   . Plantar fasciitis   . OSA (obstructive sleep apnea) 09/25/2014    Moderate OSA per home study 3/16     Past Surgical History  Procedure Laterality Date  . Vasectomy  2004    There were no vitals filed for this visit.  Visit Diagnosis:  Neck pain  Radicular pain in left arm  Neck stiffness      Subjective Assessment - 05/06/15 0807    Subjective states still pain-free but still notes intermittent n/t L UE; no pain meds since yesterday AM (took 2 ibuprofen yesterday AM as a precaution)   Currently in Pain? No/denies            [facility] PT Assessment - 05/06/15 0001    AROM   Cervical Extension 41  c-spine pressure, mild L UE n/t   Cervical - Right Side Bend 35  no pain   Cervical - Left Side Bend 26  no pain   Cervical - Right Rotation 62  no pain   Cervical - Left Rotation 69  no pain       TODAY'S TREATMENT TherEx - UBE lvl 3.0 90"/90" Hooklying on Foam Roll - t-spine extension / Pec stretch 2'; Pullover 10# 15x, UE Flex with Contra Ext Blue TB 10x each  Manual - c-spine B rotation and SB mobes grade 3, B UE median nerve glides, manual traction (traction table not availble today)                        PT Long Term Goals - 05/05/15 0748    PT LONG TERM GOAL #1   Title pt independent with HEP as necessary for continued progress  by 05/23/15   Status On-going   PT LONG TERM GOAL #2   Title pt reports neck pain no greater than 4/10 at worst and 2/10 on average by 05/23/15   Status On-going   PT LONG TERM GOAL #3   Title pt displays more upright posture (ear nearly over shoulders and shoulders over hips) by 05/23/15   Status On-going   PT LONG TERM GOAL #4   Title pt states able to sleep without limitation by neck pain by 05/23/15   Status On-going   PT LONG TERM GOAL #5   Title c-spine B rotation AROM to 60 dg or better by 05/23/15   Status On-going               Plan - 05/06/15 0816    Clinical Impression Statement AROM much improved but still asymmetric and still with L UE radicular symptoms with extension.  Addressed with manual today along with UE nerve glides.  Responded well to manual stating felt stretched out.  Did not re-assess ROM due to pt having to leave for  work.   PT Next Visit Plan c-spine Manual and UE nerve glides PRN; continue scapular retraction, pec stretch, mechanical traction   Consulted and Agree with Plan of Care Patient        Problem List Patient Active Problem List   Diagnosis Date Noted  . Cervical pain (neck) 03/07/2015  . OSA (obstructive sleep apnea) 09/25/2014  . Routine general medical examination at a health care facility 03/19/2014  . Cough 02/22/2014    Frisco Cordts PT, OCS 05/06/2015, 8:49 AM  [facility] [address]  [address] [place], [place], [address] Phone: [phone]   Fax:  [phone]

## 2015-05-09 ENCOUNTER — Ambulatory Visit: Payer: BLUE CROSS/BLUE SHIELD | Admitting: Physical Therapy

## 2015-05-09 DIAGNOSIS — M542 Cervicalgia: Secondary | ICD-10-CM

## 2015-05-09 DIAGNOSIS — M792 Neuralgia and neuritis, unspecified: Secondary | ICD-10-CM

## 2015-05-09 DIAGNOSIS — M436 Torticollis: Secondary | ICD-10-CM

## 2015-05-09 NOTE — Therapy (Signed)
[facility] Outpatient Rehabilitation [facility] [address]  [address] [place], [place], [address] Phone: [phone]   Fax:  [phone]  Physical Therapy Treatment  Patient Details  Name: James Burton MRN: [id number] Date of Birth: 1971/05/07 Referring Provider:  Hilda Lias, MD  Encounter Date: 05/09/2015      PT End of Session - 05/09/15 1053    Visit Number 5   Number of Visits 12   Date for PT Re-Evaluation 05/23/15   PT Start Time 1048   PT Stop Time 1145   PT Time Calculation (min) 57 min      Past Medical History  Diagnosis Date  . Bilateral inguinal hernia   . Plantar fasciitis   . OSA (obstructive sleep apnea) 09/25/2014    Moderate OSA per home study 3/16     Past Surgical History  Procedure Laterality Date  . Vasectomy  2004    There were no vitals filed for this visit.  Visit Diagnosis:  Neck pain  Radicular pain in left arm  Neck stiffness      Subjective Assessment - 05/09/15 1048    Subjective still pain-free but still with intermittent N/T into L UE (lateral hand and #1-2 fingers)   Currently in Pain? No/denies         TODAY'S TREATMENT TherEx - UBE lvl 3.0 90"/90" Hooklying on Foam Roll: T-spine extension / Pec stretch 2'; Pullover 10# 15x, UE Flex with Contra Ext Black TB 10x each TRX High Row 15x TRX "W" 15x TRX "Y" 2x8 Corner Pec Stretch 2x20" Standing B Shoulder ABD 5# 15x  Manual - c-spine B rotation and SB mobes grade 3, L UE median nerve glides  Mechanical Traction - c-spine, 20dg pull, 16#/8#, 60"/20", 16'               PT Long Term Goals - 05/09/15 1134    PT LONG TERM GOAL #1   Title pt independent with HEP as necessary for continued progress by 05/23/15   Status On-going   PT LONG TERM GOAL #2   Title pt reports neck pain no greater than 4/10 at worst and 2/10 on average by 05/23/15   Status Achieved   PT LONG TERM GOAL #3   Title pt displays more upright posture (ear nearly  over shoulders and shoulders over hips) by 05/23/15   Status On-going   PT LONG TERM GOAL #4   Title pt states able to sleep without limitation by neck pain by 05/23/15   Status Achieved   PT LONG TERM GOAL #5   Title c-spine B rotation AROM to 60 dg or better by 05/23/15   Status On-going               Plan - 05/09/15 1135    Clinical Impression Statement excellent progress.  Good performance with increased intensity of exercises today. Still with intermittent L UE radicular symptoms which pt states he can produce with neck AROM at times.  Symptoms not produced with c-spine mobes or therex.   PT Next Visit Plan c-spine Manual and UE nerve glides PRN; continue scapular retraction, pec stretch, mechanical traction   Consulted and Agree with Plan of Care Patient        Problem List Patient Active Problem List   Diagnosis Date Noted  . Cervical pain (neck) 03/07/2015  . OSA (obstructive sleep apnea) 09/25/2014  . Routine general medical examination at a health care facility 03/19/2014  . Cough 02/22/2014  Hadja Harral PT, OCS 05/09/2015, 11:45 AM  [facility] [address]  [address] [place], [place], [address] Phone: [phone]   Fax:  [phone]

## 2015-05-12 ENCOUNTER — Ambulatory Visit: Payer: BLUE CROSS/BLUE SHIELD | Admitting: Physical Therapy

## 2015-05-14 ENCOUNTER — Ambulatory Visit: Payer: BLUE CROSS/BLUE SHIELD | Admitting: Physical Therapy

## 2015-05-14 DIAGNOSIS — M542 Cervicalgia: Secondary | ICD-10-CM

## 2015-05-14 DIAGNOSIS — M436 Torticollis: Secondary | ICD-10-CM

## 2015-05-14 DIAGNOSIS — M792 Neuralgia and neuritis, unspecified: Secondary | ICD-10-CM

## 2015-05-14 NOTE — Therapy (Signed)
[facility] [address]  [address] [place], [place], [address] Phone: [phone]   Fax:  [phone]  Physical Therapy Treatment  Patient Details  Name: James Burton MRN: [id number] Date of Birth: 01/07/1971 Referring Provider:  Hilda Lias, MD  Encounter Date: 05/14/2015      PT End of Session - 05/14/15 0719    Visit Number 6   Number of Visits 12   Date for PT Re-Evaluation 05/23/15   PT Start Time 0716   PT Stop Time 0806   PT Time Calculation (min) 50 min      Past Medical History  Diagnosis Date  . Bilateral inguinal hernia   . Plantar fasciitis   . OSA (obstructive sleep apnea) 09/25/2014    Moderate OSA per home study 3/16     Past Surgical History  Procedure Laterality Date  . Vasectomy  2004    There were no vitals filed for this visit.  Visit Diagnosis:  Neck pain  Radicular pain in left arm  Neck stiffness      Subjective Assessment - 05/14/15 0720    Subjective intermittent stiffness/catch in neck.  States L UE n/t is decreasing in frequency lately stating only noted occasionally at this point.   Currently in Pain? No/denies        TODAY'S TREATMENT TherEx - UBE lvl 3.6 90"/90" TRX High Row 15x TRX "W" 15x TRX "Y"  Corner Pec Stretch 2x20" Prone over P-ball (65cm) scap retraction and c-spine retraction with: B Shoulder Extension 10x3", W 10x3", Y 10x3" Kneeling P-ball lat stretch rollout  Hooklying on Foam Roll: T-spine extension / Pec stretch 2'; Pullover 10# 15x  Manual: c-spine B rotation and SB mobes grade 3, c-spine retraction mobes, L UE median nerve glides  Mechanical Traction - c-spine, 20dg pull, 16#/8#, 60"/20", 6' (8-10' total with ramp up/down time)                PT Long Term Goals - 05/09/15 1134    PT LONG TERM GOAL #1   Title pt independent with HEP as necessary for continued progress by 05/23/15   Status On-going   PT LONG TERM GOAL #2   Title pt reports neck pain no greater than 4/10 at worst and 2/10 on average by 05/23/15   Status Achieved   PT LONG TERM GOAL #3   Title pt displays more upright posture (ear nearly over shoulders and shoulders over hips) by 05/23/15   Status On-going   PT LONG TERM GOAL #4   Title pt states able to sleep without limitation by neck pain by 05/23/15   Status Achieved   PT LONG TERM GOAL #5   Title c-spine B rotation AROM to 60 dg or better by 05/23/15   Status On-going               Plan - 05/14/15 0827    Clinical Impression Statement decreased traction time today due to pt having to leave for work.  Increased intensity of c-spine stability exercises without pt c/o pain.   PT Next Visit Plan c-spine Manual and UE nerve glides PRN; continue scapular retraction, pec stretch, mechanical traction   Consulted and Agree with Plan of Care Patient        Problem List Patient Active Problem List   Diagnosis Date Noted  . Cervical pain (neck) 03/07/2015  . OSA (obstructive sleep apnea) 09/25/2014  . Routine general medical examination at a health  care facility 03/19/2014  . Cough 02/22/2014    Carrol Bondar PT, OCS 05/14/2015, 8:30 AM  [facility] [address]  [address] [place], [place], [address] Phone: [phone]   Fax:  [phone]

## 2015-05-15 ENCOUNTER — Ambulatory Visit: Payer: BLUE CROSS/BLUE SHIELD | Attending: Neurosurgery | Admitting: Physical Therapy

## 2015-05-15 DIAGNOSIS — M792 Neuralgia and neuritis, unspecified: Secondary | ICD-10-CM

## 2015-05-15 DIAGNOSIS — M436 Torticollis: Secondary | ICD-10-CM

## 2015-05-15 DIAGNOSIS — M542 Cervicalgia: Secondary | ICD-10-CM

## 2015-05-15 NOTE — Therapy (Signed)
[facility] [address]  [address] [place], [place], [address] Phone: [phone]   Fax:  [phone]  Physical Therapy Treatment  Patient Details  Name: James Burton MRN: [id number] Date of Birth: 19-Apr-1971 Referring Provider:  Hilda Lias, MD  Encounter Date: 05/15/2015      PT End of Session - 05/15/15 1615    Visit Number 7   Number of Visits 12   Date for PT Re-Evaluation 05/23/15   PT Start Time 1612   PT Stop Time 1711   PT Time Calculation (min) 59 min      Past Medical History  Diagnosis Date  . Bilateral inguinal hernia   . Plantar fasciitis   . OSA (obstructive sleep apnea) 09/25/2014    Moderate OSA per home study 3/16     Past Surgical History  Procedure Laterality Date  . Vasectomy  2004    There were no vitals filed for this visit.  Visit Diagnosis:  Neck pain  Radicular pain in left arm  Neck stiffness      Subjective Assessment - 05/15/15 1617    Subjective states still pain-free but continues to note n/t into L digits #1-2   Currently in Pain? No/denies           TODAY'S TREATMENT TherEx - UBE lvl 4.0 90"/90" T-spine ext foam roller 12x Hooklying on Foam Roller: t-spine extension/pec stretch x 2', upper c-spine flexion 10x3", pullover 10# 15x Low Row 55# 15x, 65# 15x Corner Pec Stretch 2x20" Seated upper trap and scalene stretch (notes L UE n/t with this) High Row 45# 2x15 Prone over Plinth corner: B Shoulder Extension 10x3", W 10x3", Y 10x3" (with HEP instruction)  Mechanical Traction - c-spine, 20dg pull, 18#/9#, 60"/20", 15'                       PT Education - 05/15/15 1659    Education provided Yes   Education Details HEP update   Person(s) Educated Patient   Methods Explanation;Demonstration;Handout   Comprehension Verbalized understanding;Returned demonstration             PT Long Term Goals - 05/09/15 1134    PT LONG TERM GOAL  #1   Title pt independent with HEP as necessary for continued progress by 05/23/15   Status On-going   PT LONG TERM GOAL #2   Title pt reports neck pain no greater than 4/10 at worst and 2/10 on average by 05/23/15   Status Achieved   PT LONG TERM GOAL #3   Title pt displays more upright posture (ear nearly over shoulders and shoulders over hips) by 05/23/15   Status On-going   PT LONG TERM GOAL #4   Title pt states able to sleep without limitation by neck pain by 05/23/15   Status Achieved   PT LONG TERM GOAL #5   Title c-spine B rotation AROM to 60 dg or better by 05/23/15   Status On-going               Plan - 05/15/15 1745    Clinical Impression Statement increased HEP today to include c-spine stabiliy as well UT and scalene stretches.  Notes L UE n/t with first set of stretches but then went away for following sets.   PT Next Visit Plan c-spine Manual and UE nerve glides PRN; continue scapular retraction, pec stretch, mechanical traction   Consulted and Agree with Plan of Care Patient  Problem List Patient Active Problem List   Diagnosis Date Noted  . Cervical pain (neck) 03/07/2015  . OSA (obstructive sleep apnea) 09/25/2014  . Routine general medical examination at a health care facility 03/19/2014  . Cough 02/22/2014    Elvin Banker PT, OCS 05/15/2015, 5:47 PM  [facility] [address]  [address] [place], [place], [address] Phone: [phone]   Fax:  [phone]

## 2015-05-16 ENCOUNTER — Ambulatory Visit: Payer: BLUE CROSS/BLUE SHIELD | Admitting: Physical Therapy

## 2015-05-20 ENCOUNTER — Ambulatory Visit: Payer: BLUE CROSS/BLUE SHIELD | Admitting: Physical Therapy

## 2015-05-21 ENCOUNTER — Ambulatory Visit: Payer: BLUE CROSS/BLUE SHIELD | Admitting: Physical Therapy

## 2015-05-21 DIAGNOSIS — M542 Cervicalgia: Secondary | ICD-10-CM | POA: Diagnosis not present

## 2015-05-21 DIAGNOSIS — M792 Neuralgia and neuritis, unspecified: Secondary | ICD-10-CM

## 2015-05-21 DIAGNOSIS — M436 Torticollis: Secondary | ICD-10-CM

## 2015-05-21 NOTE — Therapy (Signed)
[facility] [address]  [address] [place], [place], [address] Phone: [phone]   Fax:  [phone]  Physical Therapy Treatment  Patient Details  Name: James Burton MRN: [id number] Date of Birth: May 04, 1971 Referring Provider:  Hilda Lias, MD  Encounter Date: 05/21/2015      PT End of Session - 05/21/15 1062    Visit Number 8   Number of Visits 12   Date for PT Re-Evaluation 05/23/15   PT Start Time 0722   PT Stop Time 0815   PT Time Calculation (min) 53 min      Past Medical History  Diagnosis Date  . Bilateral inguinal hernia   . Plantar fasciitis   . OSA (obstructive sleep apnea) 09/25/2014    Moderate OSA per home study 3/16     Past Surgical History  Procedure Laterality Date  . Vasectomy  2004    There were no vitals filed for this visit.  Visit Diagnosis:  Neck pain  Radicular pain in left arm  Neck stiffness      Subjective Assessment - 05/21/15 0724    Subjective No neck pain but states noted B elbow pain after last treatment (ache) and pt thinks due to Low Row but unsure.  States continues to note improvement in that L UE n/t less frequent and less intense.   Currently in Pain? No/denies            [facility] PT Assessment - 05/21/15 0001    AROM   Cervical Extension 50  notes short duration n/t L #1-2 digits   Cervical - Right Side Bend 37   Cervical - Left Side Bend 33  pull on R   Cervical - Right Rotation 68   Cervical - Left Rotation 73          TODAY'S TREATMENT TherEx - UBE lvl 4.5 90"/90" [place] 45# 2x15 Standing B Shoulder ABD 5# 2x15 T-spine ext foam roller 12x Hooklying over perpendicular Foam Roller: pullover 10# 15x TRX Y 10x Corner Pec Stretch 2x20" TRX W 10x Seated B Shoulder Press 10# dbs 15x (fatigue in shoulders) Seated upper trap TRX Chest Press 15x  Mechanical Traction - c-spine, 20dg pull, 18#/9#, 60"/20", 15'                          PT Long Term Goals - 05/21/15 6948    PT LONG TERM GOAL #1   Title pt independent with HEP as necessary for continued progress by 05/23/15   Status On-going   PT LONG TERM GOAL #2   Title pt reports neck pain no greater than 4/10 at worst and 2/10 on average by 05/23/15   Status Achieved   PT LONG TERM GOAL #3   Title pt displays more upright posture (ear nearly over shoulders and shoulders over hips) by 05/23/15   Status Achieved   PT LONG TERM GOAL #4   Title pt states able to sleep without limitation by neck pain by 05/23/15   Status Achieved   PT LONG TERM GOAL #5   Title c-spine B rotation AROM to 60 dg or better by 05/23/15   Status Achieved               Plan - 05/21/15 0757    Clinical Impression Statement pt reports continued decrease in frequency and intensity of L UE radicular symptoms; c-spine AROM improved to [facility] all planes but did  note n/t in L UE with extension.  Excellent progress overall and goals basically met.  Will review HEP and may discharge at next visit.   PT Next Visit Plan possible d/c next visit; c-spine Manual and UE nerve glides PRN; continue scapular retraction, pec stretch, mechanical traction   Consulted and Agree with Plan of Care Patient        Problem List Patient Active Problem List   Diagnosis Date Noted  . Cervical pain (neck) 03/07/2015  . OSA (obstructive sleep apnea) 09/25/2014  . Routine general medical examination at a health care facility 03/19/2014  . Cough 02/22/2014    Levis Nazir PT, OCS 05/21/2015, 8:16 AM  [facility] [address]  [address] [place], [place], [address] Phone: [phone]   Fax:  [phone]

## 2015-05-23 ENCOUNTER — Ambulatory Visit: Payer: BLUE CROSS/BLUE SHIELD | Admitting: Physical Therapy

## 2015-05-30 ENCOUNTER — Ambulatory Visit: Payer: BLUE CROSS/BLUE SHIELD | Admitting: Physical Therapy

## 2015-05-30 DIAGNOSIS — M792 Neuralgia and neuritis, unspecified: Secondary | ICD-10-CM

## 2015-05-30 DIAGNOSIS — M542 Cervicalgia: Secondary | ICD-10-CM | POA: Diagnosis not present

## 2015-05-30 DIAGNOSIS — M436 Torticollis: Secondary | ICD-10-CM

## 2015-05-30 NOTE — Therapy (Addendum)
[facility] [address]  [address] [place], [place], [address] Phone: [phone]   Fax:  [phone]  Physical Therapy Treatment  Patient Details  Name: James Burton MRN: [id number] Date of Birth: April 16, 1971 Referring Provider:  Hilda Lias, MD  Encounter Date: 05/30/2015      PT End of Session - 05/30/15 0720    Visit Number 9   Number of Visits 12   Date for PT Re-Evaluation 05/23/15   PT Start Time 0718   PT Stop Time 0818   PT Time Calculation (min) 60 min      Past Medical History  Diagnosis Date  . Bilateral inguinal hernia   . Plantar fasciitis   . OSA (obstructive sleep apnea) 09/25/2014    Moderate OSA per home study 3/16     Past Surgical History  Procedure Laterality Date  . Vasectomy  2004    There were no vitals filed for this visit.  Visit Diagnosis:  Neck pain  Radicular pain in left arm  Neck stiffness      Subjective Assessment - 05/30/15 0721    Subjective Pt states has been pain-free since injection other than yesterday when he noted dull ache pain in L lower scap rating 2-3/10.  States did more sitting yesterday (driving) and this may be why in more pain.  States is pain-free today.  He states he feels comfortable with self-managing his symptoms at this time and holding PT for now.   Currently in Pain? No/denies            [facility] PT Assessment - 05/30/15 0001    Observation/Other Assessments   Focus on Therapeutic Outcomes (FOTO)  34% limitation   AROM   Cervical Extension 54  no symptoms   Cervical - Right Side Bend 25   Cervical - Left Side Bend 25   Cervical - Right Rotation 70   Cervical - Left Rotation 71       TODAY'S TREATMENT TherEx - UBE lvl 4.0 90"/90" Foam Roller t-spine extension rolling Hooklying Foam Roller: pec and t-spine extension stretch 2', B D2 Flexion 5# 15x, B ER Blue TB 20x Cross foam roller pullover 10# 15x Low Row 45# 15x, 55#  15x Corner Pec Stretch 2x20" TRX Y 2x12 Standing B Shoulder ABD 5# 2x15 Seated UT Stretch and seated scalene stretches 2x20" each  c-spine AROM asssessment  Mechanical Traction - c-spine, 20dg pull, 18#/9#, 60"/20", 18'               PT Long Term Goals - 05/30/15 4010    PT LONG TERM GOAL #1   Title pt independent with HEP as necessary for continued progress by 05/23/15   Status Achieved   PT LONG TERM GOAL #2   Title pt reports neck pain no greater than 4/10 at worst and 2/10 on average by 05/23/15   Status Achieved   PT LONG TERM GOAL #3   Title pt displays more upright posture (ear nearly over shoulders and shoulders over hips) by 05/23/15   Status Achieved   PT LONG TERM GOAL #4   Title pt states able to sleep without limitation by neck pain by 05/23/15   Status Achieved   PT LONG TERM GOAL #5   Title c-spine B rotation AROM to 60 dg or better by 05/23/15   Status Achieved               Plan - 05/30/15 2725  Clinical Impression Statement pt noted L scapular pain yesterday but otherwise has been pain-free for past few weeks.  His c-spine Extension was best to date today (54 degrees) and for 1st time he did not note L UE radicular symptoms with testing.  C-spine B rotation also very good today (70-71 degrees bilaterally without c/o symptoms); however B side-bending more retricted than in prior treatments but no pain noted with AROM.  Overall James Burton has progressed better than expected and he is happy with current level of function.  We have discussed importance of trying to improve his forward head posture as a way to limit neck strain and he understands but admits it is difficult to change.  He feels he is capable of self-managing his HEP and symptoms at this point and he is therefore being placed on hold from PT for up to 30 days while he performs HEP.  If his symptoms return he is to call and return to PT; otherwise we will discharge him in 30 days.   PT Next Visit  Plan pt on hold from PT until 06/28/15   Consulted and Agree with Plan of Care Patient        Problem List Patient Active Problem List   Diagnosis Date Noted  . Cervical pain (neck) 03/07/2015  . OSA (obstructive sleep apnea) 09/25/2014  . Routine general medical examination at a health care facility 03/19/2014  . Cough 02/22/2014    Naveh Rickles PT, OCS 05/30/2015, 9:30 AM  [facility] [address]  [address] [place], [place], [address] Phone: [phone]   Fax:  [phone]     PHYSICAL THERAPY DISCHARGE SUMMARY  Visits from Start of Care: 9 orf 12  Current functional level related to goals / functional outcomes: All goals met.  Pt last seen on 05/30/15 at which time he basically pain-free.  He was pleased with progress and denied functional limitations so was placed on hold.   Remaining deficits: None   Education / Equipment: HEP Plan: Patient agrees to discharge.  Patient goals were met. Patient is being discharged due to meeting the stated rehab goals.  ?????        Juan Quam PT, OCS 07/01/2015 7:45 AM

## 2016-01-28 ENCOUNTER — Ambulatory Visit (INDEPENDENT_AMBULATORY_CARE_PROVIDER_SITE_OTHER): Payer: BLUE CROSS/BLUE SHIELD | Admitting: Podiatry

## 2016-01-28 ENCOUNTER — Encounter: Payer: Self-pay | Admitting: Podiatry

## 2016-01-28 VITALS — BP 137/86 | HR 89 | Resp 18

## 2016-01-28 DIAGNOSIS — M722 Plantar fascial fibromatosis: Secondary | ICD-10-CM | POA: Diagnosis not present

## 2016-01-28 MED ORDER — MELOXICAM 15 MG PO TABS: 15.0000 mg | ORAL_TABLET | Freq: Every day | ORAL | Status: DC

## 2016-01-28 MED FILL — MELOXICAM 15 MG TABLET: 15 | 30 days supply | Qty: 30 | Fill #0

## 2016-01-28 NOTE — Progress Notes (Signed)
   Subjective:    Patient ID: James Burton, male    DOB: 08-23-1971, 45 y.o.   MRN: [id number]  HPI  45 year old male presents the operative conditions a right heel pain which is ongoing prostate 6-8 months. He states he previously had plantar fasciitis on the left foot however this resolved. This was several years ago. He denies any recent injury or trauma to the right foot. No numbness or tingling. No swelling or redness. Pain is worse in the morning when he first gets up or after seeing for some time which is relieved by ambulation. He is in no recent treatment. No other complaints.   Review of Systems  All other systems reviewed and are negative.      Objective:   Physical Exam General: AAO x3, NAD  Dermatological: Skin is warm, dry and supple bilateral. Nails x 10 are well manicured; remaining integument appears unremarkable at this time. There are no open sores, no preulcerative lesions, no rash or signs of infection present.  Vascular: Dorsalis Pedis artery and Posterior Tibial artery pedal pulses are 2/4 bilateral with immedate capillary fill time. Pedal hair growth present. No varicosities and no lower extremity edema present bilateral. There is no pain with calf compression, swelling, warmth, erythema.   Neruologic: Grossly intact via light touch bilateral. Vibratory intact via tuning fork bilateral. Protective threshold with Semmes Wienstein monofilament intact to all pedal sites bilateral. Patellar and Achilles deep tendon reflexes 2+ bilateral. No Babinski or clonus noted bilateral.   Musculoskeletal: Tenderness to palpation along the plantar medial tubercle of the calcaneus at the insertion of plantar fascia on the right foot. There is no pain along the course of the plantar fascia within the arch of the foot. Plantar fascia appears to be intact. There is no pain with lateral compression of the calcaneus or pain with vibratory sensation. There is no pain along the course or  insertion of the achilles tendon. No other areas of tenderness to bilateral lower extremities. No pain to the left foot. Muscular strength 5/5 in all groups tested bilateral.  Gait: Unassisted, Nonantalgic.      Assessment & Plan:  45 year old male right heel pain, likely plantar fasciitis -Treatment options discussed including all alternatives, risks, and complications -Etiology of symptoms were discussed -Ordered x-rays. -Patient elects to proceed with steroid injection into the right heel. Under sterile skin preparation, a total of 2.5cc of kenalog 10, 0.5% Marcaine plain, and 2% lidocaine plain were infiltrated into the symptomatic area without complication. A band-aid was applied. Patient tolerated the injection well without complication. Post-injection care with discussed with the patient. Discussed with the patient to ice the area over the next couple of days to help prevent a steroid flare.  -Plantar fascial brace. -Prescribed mobic. Discussed side effects of the medication and directed to stop if any are to occur and call the office.  -Stretching icing exercises daily. -Shoe gear modifications -Discussed orthotics -Follow-up in 3 weeks or sooner if any problems arise. In the meantime, encouraged to call the office with any questions, concerns, change in symptoms.   Celesta Gentile, DPM

## 2016-01-28 NOTE — Patient Instructions (Signed)

## 2016-01-29 ENCOUNTER — Encounter: Payer: Self-pay | Admitting: Podiatry

## 2016-01-29 DIAGNOSIS — M722 Plantar fascial fibromatosis: Secondary | ICD-10-CM | POA: Insufficient documentation

## 2016-02-16 ENCOUNTER — Other Ambulatory Visit: Payer: Self-pay | Admitting: Neurosurgery

## 2016-02-16 DIAGNOSIS — M5412 Radiculopathy, cervical region: Secondary | ICD-10-CM | POA: Diagnosis not present

## 2016-02-16 DIAGNOSIS — R030 Elevated blood-pressure reading, without diagnosis of hypertension: Secondary | ICD-10-CM | POA: Diagnosis not present

## 2016-02-16 DIAGNOSIS — Z6830 Body mass index (BMI) 30.0-30.9, adult: Secondary | ICD-10-CM | POA: Diagnosis not present

## 2016-02-18 ENCOUNTER — Encounter: Payer: Self-pay | Admitting: Podiatry

## 2016-02-18 ENCOUNTER — Ambulatory Visit (INDEPENDENT_AMBULATORY_CARE_PROVIDER_SITE_OTHER): Payer: BLUE CROSS/BLUE SHIELD | Admitting: Podiatry

## 2016-02-18 DIAGNOSIS — M722 Plantar fascial fibromatosis: Secondary | ICD-10-CM

## 2016-02-18 MED ORDER — METHYLPREDNISOLONE 4 MG PO TBPK: ORAL_TABLET | ORAL | Status: DC

## 2016-02-18 MED FILL — METHYLPREDNISOLONE 4 MG TAB: 4 | 6 days supply | Qty: 21 | Fill #0

## 2016-02-18 NOTE — Progress Notes (Signed)
Patient ID: James Burton, male   DOB: [birth year], 45 y.o.   MRN: [id number]  Subjective: 45 year old male presents Today for follow-up evaluation of right heel pain. He states that he has improved compared to last appointment although he still gets pain on his heel has shifted somewhat more laterally. He states that the pain was doing good until Monday when he was shoveling putting more pressure to his right foot. He has been stretching icing as well as when the plantar fascial brace. He's been taking meloxicam without any side effects. No other complaints.  Objective: AAO x3, NAD DP/PT pulses palpable bilaterally, CRT less than 3 seconds Continuation tenderness to the plantar aspect of the right heel and one on the plantar lateral aspect. There is no significant tenderness on the plantar medial aspect. There is no pain within the arch of the foot. No peritoneal lateral compression. No pinpoint bony tenderness or pain the vibratory sensation. No areas of pinpoint bony tenderness or pain with vibratory sensation. MMT 5/5, ROM WNL. No edema, erythema, increase in warmth to bilateral lower extremities.  No open lesions or pre-ulcerative lesions.  No pain with calf compression, swelling, warmth, erythema  Assessment: Continuation plantar fasciitis although somewhat improved  Plan: -All treatment options discussed with the patient including all alternatives, risks, complications.  -Patient elects to proceed with steroid injection into the right heel. Under sterile skin preparation, a total of 2.5cc of kenalog 10, 0.5% Marcaine plain, and 2% lidocaine plain were infiltrated into the symptomatic area without complication. A band-aid was applied. Patient tolerated the injection well without complication. Post-injection care with discussed with the patient. Discussed with the patient to ice the area over the next couple of days to help prevent a steroid flare.  -Prescribed Medrol Dosepak. Once this  complete can restart meloxicam but not to the other. Verbally understood. -Continue stretching, icing daily. -Shoe gear modifications I can discuss orthotics -Follow-up in 4 weeks or sooner if needed. -Patient encouraged to call the office with any questions, concerns, change in symptoms.   Celesta Gentile, DPM

## 2016-03-10 ENCOUNTER — Ambulatory Visit
Admission: RE | Admit: 2016-03-10 | Discharge: 2016-03-10 | Disposition: A | Discharge status: Home / Self Care | Payer: BLUE CROSS/BLUE SHIELD | Source: Ambulatory Visit | Attending: Neurosurgery | Admitting: Neurosurgery

## 2016-03-10 VITALS — BP 138/85 | HR 68

## 2016-03-10 DIAGNOSIS — M5412 Radiculopathy, cervical region: Secondary | ICD-10-CM

## 2016-03-10 DIAGNOSIS — M542 Cervicalgia: Secondary | ICD-10-CM

## 2016-03-10 DIAGNOSIS — M47812 Spondylosis without myelopathy or radiculopathy, cervical region: Secondary | ICD-10-CM | POA: Diagnosis not present

## 2016-03-10 MED ORDER — DIAZEPAM 5 MG PO TABS
10.0000 mg | ORAL_TABLET | Freq: Once | ORAL | Status: AC
  Administered 2016-03-10: 10 mg via ORAL

## 2016-03-10 MED ORDER — IOPAMIDOL (ISOVUE-M 300) INJECTION 61%
1.0000 mL | Freq: Once | INTRAMUSCULAR | Status: AC | PRN
  Administered 2016-03-10: 1 mL via EPIDURAL

## 2016-03-10 MED ORDER — TRIAMCINOLONE ACETONIDE 40 MG/ML IJ SUSP (RADIOLOGY)
60.0000 mg | Freq: Once | INTRAMUSCULAR | Status: AC
  Administered 2016-03-10: 60 mg via EPIDURAL

## 2016-03-10 NOTE — Discharge Instructions (Signed)

## 2016-03-24 ENCOUNTER — Ambulatory Visit: Payer: BLUE CROSS/BLUE SHIELD | Admitting: Podiatry

## 2016-04-14 ENCOUNTER — Encounter: Payer: Self-pay | Admitting: Podiatry

## 2016-04-14 ENCOUNTER — Ambulatory Visit (INDEPENDENT_AMBULATORY_CARE_PROVIDER_SITE_OTHER): Payer: BLUE CROSS/BLUE SHIELD | Admitting: Podiatry

## 2016-04-14 DIAGNOSIS — M722 Plantar fascial fibromatosis: Secondary | ICD-10-CM | POA: Diagnosis not present

## 2016-04-26 NOTE — Progress Notes (Signed)
Patient ID: HAYNES SRIRAM, male   DOB: 1970/11/04, 45 y.o.   MRN: [id number]  Subjective: 45 year old male presents today for follow-up evaluation of right heel pain. He states that Sunday and Monday he is having recurrent pain however the pain has subsided is coming on extremes any discomfort. He's been stretching icing. Able to regular shoe without any difficulty. No pain in the morning. No other complaints at this time.  Objective: AAO x3, NAD DP/PT pulses palpable bilaterally, CRT less than 3 seconds There is currently no tenderness to the plantar aspect of the right heel and one on the plantar lateral aspect. There is no significant tenderness on the plantar medial aspect. There is no pain within the arch of the foot. No peritoneal lateral compression. No pinpoint bony tenderness or pain the vibratory sensation. No areas of pinpoint bony tenderness or pain with vibratory sensation. MMT 5/5, ROM WNL. No edema, erythema, increase in warmth to bilateral lower extremities.  No open lesions or pre-ulcerative lesions.  No pain with calf compression, swelling, warmth, erythema  Assessment: Continuation plantar fasciitis improved.   Plan: -All treatment options discussed with the patient including all alternatives, risks, complications.  -At this time his symptoms have resolved. We'll hold off on further steroid injection. Discussed with him continue stretching, icing as well as supportive shoes, bracing as well as inserts. -Follow-up if any symptoms recur or worsen. Call any questions concerns.  Celesta Gentile, DPM

## 2016-08-09 ENCOUNTER — Encounter: Payer: Self-pay | Admitting: Family

## 2016-08-09 ENCOUNTER — Telehealth: Payer: Self-pay | Admitting: Family

## 2016-08-09 NOTE — Telephone Encounter (Signed)
No charge. 

## 2016-08-09 NOTE — Telephone Encounter (Signed)
Patient lvm today at 8:20am cancelling 2:45pm physical due to not being able to fast for labs, patient stated he didn't want to make 2 trips and will [facility], lvm advising patient to call back,  charge or no charge

## 2016-08-10 ENCOUNTER — Ambulatory Visit (INDEPENDENT_AMBULATORY_CARE_PROVIDER_SITE_OTHER): Payer: BLUE CROSS/BLUE SHIELD | Admitting: Family

## 2016-08-10 ENCOUNTER — Encounter: Payer: Self-pay | Admitting: Family

## 2016-08-10 VITALS — BP 136/80 | HR 64 | Temp 97.9°F | Resp 16 | Ht 73.8 in | Wt 232.6 lb

## 2016-08-10 DIAGNOSIS — L989 Disorder of the skin and subcutaneous tissue, unspecified: Secondary | ICD-10-CM

## 2016-08-10 DIAGNOSIS — Z23 Encounter for immunization: Secondary | ICD-10-CM

## 2016-08-10 DIAGNOSIS — Z0000 Encounter for general adult medical examination without abnormal findings: Secondary | ICD-10-CM | POA: Diagnosis not present

## 2016-08-10 DIAGNOSIS — G4733 Obstructive sleep apnea (adult) (pediatric): Secondary | ICD-10-CM

## 2016-08-10 LAB — URINALYSIS, ROUTINE W REFLEX MICROSCOPIC
BILIRUBIN URINE: NEGATIVE
Hgb urine dipstick: NEGATIVE
KETONES UR: NEGATIVE
Leukocytes, UA: NEGATIVE
Nitrite: NEGATIVE
PH: 6 (ref 5.0–8.0)
SPECIFIC GRAVITY, URINE: 1.025 (ref 1.000–1.030)
TOTAL PROTEIN, URINE-UPE24: NEGATIVE
URINE GLUCOSE: NEGATIVE
Urobilinogen, UA: 0.2 (ref 0.0–1.0)

## 2016-08-10 LAB — LIPID PANEL
CHOL/HDL RATIO: 6
Cholesterol: 194 mg/dL (ref 0–200)
HDL: 30.5 mg/dL — AB (ref >39.00)
LDL CALC: 137 mg/dL — AB (ref 0–99)
NONHDL: 163.99
Triglycerides: 136 mg/dL (ref 0.0–149.0)
VLDL: 27.2 mg/dL (ref 0.0–40.0)

## 2016-08-10 LAB — BASIC METABOLIC PANEL
BUN: 20 mg/dL (ref 6–23)
CALCIUM: 9.5 mg/dL (ref 8.4–10.5)
CO2: 30 mEq/L (ref 19–32)
CREATININE: 1.2 mg/dL (ref 0.40–1.50)
Chloride: 105 mEq/L (ref 96–112)
GFR: 69.4 mL/min (ref >60.00)
GLUCOSE: 94 mg/dL (ref 70–99)
Potassium: 4.6 mEq/L (ref 3.5–5.1)
SODIUM: 142 meq/L (ref 135–145)

## 2016-08-10 LAB — HEPATIC FUNCTION PANEL
ALBUMIN: 4.7 g/dL (ref 3.5–5.2)
ALK PHOS: 90 U/L (ref 39–117)
ALT: 44 U/L (ref 0–53)
AST: 26 U/L (ref 0–37)
BILIRUBIN DIRECT: 0.1 mg/dL (ref 0.0–0.3)
Total Bilirubin: 0.6 mg/dL (ref 0.2–1.2)
Total Protein: 7.2 g/dL (ref 6.0–8.3)

## 2016-08-10 LAB — TSH: TSH: 1.46 u[IU]/mL (ref 0.35–4.50)

## 2016-08-10 NOTE — Patient Instructions (Addendum)
Please complete lab work prior to leaving. Work on a low sodium diet, exercise and weight loss. Follow up in 3 months so we can recheck your blood pressure. You will be contacted about your referral to Dermatology and Pulmonology to discuss your CPAP.   Influenza Virus Vaccine injection (Fluarix) What is this medicine? INFLUENZA VIRUS VACCINE (in floo EN zuh VAHY ruhs vak SEEN) helps to reduce the risk of getting influenza also known as the flu. This medicine may be used for other purposes; ask your health care provider or pharmacist if you have questions. COMMON BRAND NAME(S): Fluarix, Fluzone What should I tell my health care provider before I take this medicine? They need to know if you have any of these conditions: -bleeding disorder like hemophilia -fever or infection -Guillain-Barre syndrome or other neurological problems -immune system problems -infection with the human immunodeficiency virus (HIV) or AIDS -low blood platelet counts -multiple sclerosis -an unusual or allergic reaction to influenza virus vaccine, eggs, chicken proteins, latex, gentamicin, other medicines, foods, dyes or preservatives -pregnant or trying to get pregnant -breast-feeding How should I use this medicine? This vaccine is for injection into a muscle. It is given by a health care professional. A copy of Vaccine Information Statements will be given before each vaccination. Read this sheet carefully each time. The sheet may change frequently. Talk to your pediatrician regarding the use of this medicine in children. Special care may be needed. Overdosage: If you think you have taken too much of this medicine contact a poison control center or emergency room at once. NOTE: This medicine is only for you. Do not share this medicine with others. What if I miss a dose? This does not apply. What may interact with this medicine? -chemotherapy or radiation therapy -medicines that lower your immune system like  etanercept, anakinra, infliximab, and adalimumab -medicines that treat or prevent blood clots like warfarin -phenytoin -steroid medicines like prednisone or cortisone -theophylline -vaccines This list may not describe all possible interactions. Give your health care provider a list of all the medicines, herbs, non-prescription drugs, or dietary supplements you use. Also tell them if you smoke, drink alcohol, or use illegal drugs. Some items may interact with your medicine. What should I watch for while using this medicine? Report any side effects that do not go away within 3 days to your doctor or health care professional. Call your health care provider if any unusual symptoms occur within 6 weeks of receiving this vaccine. You may still catch the flu, but the illness is not usually as bad. You cannot get the flu from the vaccine. The vaccine will not protect against colds or other illnesses that may cause fever. The vaccine is needed every year. What side effects may I notice from receiving this medicine? Side effects that you should report to your doctor or health care professional as soon as possible: -allergic reactions like skin rash, itching or hives, swelling of the face, lips, or tongue Side effects that usually do not require medical attention (report to your doctor or health care professional if they continue or are bothersome): -fever -headache -muscle aches and pains -pain, tenderness, redness, or swelling at site where injected -weak or tired This list may not describe all possible side effects. Call your doctor for medical advice about side effects. You may report side effects to FDA at 1-800-FDA-1088. Where should I keep my medicine? This vaccine is only given in a clinic, pharmacy, doctor's office, or other health care setting and  will not be stored at home. NOTE: This sheet is a summary. It may not cover all possible information. If you have questions about this medicine, talk  to your doctor, pharmacist, or health care provider.  2017 Elsevier/Gold Standard (2008-03-27 09:30:40)

## 2016-08-10 NOTE — Progress Notes (Signed)
Pre visit review using our clinic review tool, if applicable. No additional management support is needed unless otherwise documented below in the visit note. 

## 2016-08-10 NOTE — Progress Notes (Signed)
Subjective:    Patient ID: James Burton, male    DOB: 07-28-1971, 45 y.o.   MRN: [id number]  HPI  James Burton is a 45 yr old male who presents today for cpx.  Immunizations: tetanus up to date, flu shot today.   Diet: fair diet Exercise: active in his job, rides a bike Dental: up to date Vision: up to date  OSA: Tried CPAP, was unable to go to sleep with the CPAP.    Wt Readings from Last 3 Encounters:  08/10/16 232 lb 9.6 oz (105.5 kg)  04/11/15 225 lb (102.1 kg)  03/07/15 227 lb (103 kg)   BP Readings from Last 3 Encounters:  08/10/16 136/80  03/10/16 138/85  01/28/16 137/86      Review of Systems  Constitutional: Negative for unexpected weight change.  HENT: Negative for hearing loss and rhinorrhea.   Eyes:       Some issues with reading up close  Respiratory: Negative for cough and shortness of breath.   Cardiovascular: Negative for chest pain.  Gastrointestinal: Negative for blood in stool, constipation and diarrhea.  Genitourinary: Negative for dysuria and frequency.  Musculoskeletal: Negative for arthralgias and myalgias.  Skin: Negative for rash.  Neurological: Negative for headaches.  Hematological: Negative for adenopathy.  Psychiatric/Behavioral:       Denies depression/anxiety   Past Medical History:  Diagnosis Date  . Bilateral inguinal hernia   . OSA (obstructive sleep apnea) 09/25/2014   Moderate OSA per home study 3/16   . Plantar fasciitis      Social History   Social History  . Marital status: Married    Spouse name: N/A  . Number of children: N/A  . Years of education: N/A   Occupational History  . Not on file.   Social History Main Topics  . Smoking status: Never Smoker  . Smokeless tobacco: Never Used  . Alcohol use No  . Drug use: Unknown  . Sexual activity: Not on file   Other Topics Concern  . Not on file   Social History Narrative   2 boys- ages 60 and 89   Married   Enjoys Designer, fashion/clothing   Completed 4 yrs of  Media planner schooling   Works as Facilities manager    Past Surgical History:  Procedure Laterality Date  . VASECTOMY  2004    Family History  Problem Relation Age of Onset  . Diabetes Father   . Cancer Maternal Grandfather     bone, skin    Allergies  Allergen Reactions  . Sudafed Pe [Phenylephrine] Other (See Comments)    Rash on palms    Current Outpatient Prescriptions on File Prior to Visit  Medication Sig Dispense Refill  . traMADol (ULTRAM) 50 MG tablet Take 1 tablet (50 mg total) by mouth every 8 (eight) hours as needed. 30 tablet 0  . diazepam (VALIUM) 5 MG tablet One tab by mouth 30 minutes prior to MRI (Patient not taking: Reported on 08/10/2016) 1 tablet 0  . meloxicam (MOBIC) 15 MG tablet Take 1 tablet (15 mg total) by mouth daily. 30 tablet 2   No current facility-administered medications on file prior to visit.     BP 136/80   Pulse 64   Temp 97.9 F (36.6 C) (Oral)   Resp 16   Ht 6' 1.8" (1.875 m)   Wt 232 lb 9.6 oz (105.5 kg)   SpO2 99%   BMI 30.03 kg/m       Objective:  Physical Exam  Physical Exam  Constitutional: He is oriented to person, place, and time. He appears well-developed and well-nourished. No distress.  HENT:  Head: Normocephalic and atraumatic.  Right Ear: Tympanic membrane and ear canal normal.  Left Ear: Tympanic membrane and ear canal normal.  Mouth/Throat: Oropharynx is clear and moist.  Eyes: Pupils are equal, round, and reactive to light. No scleral icterus.  Neck: Normal range of motion. No thyromegaly present.  Cardiovascular: Normal rate and regular rhythm.   No murmur heard. Pulmonary/Chest: Effort normal and breath sounds normal. No respiratory distress. He has no wheezes. He has no rales. He exhibits no tenderness.  Abdominal: Soft. Bowel sounds are normal. He exhibits no distension and no mass. There is no tenderness. There is no rebound and no guarding.  Musculoskeletal: He exhibits no edema.  Lymphadenopathy:      He has no cervical adenopathy.  Neurological: He is alert and oriented to person, place, and time. He has normal patellar reflexes. He exhibits normal muscle tone. Coordination normal.  Skin: Skin is warm and dry. several dry skin lesions noted on right arm Psychiatric: He has a normal mood and affect. His behavior is normal. Judgment and thought content normal.           Assessment & Plan:         Assessment & Plan:  Preventative Care- discussed healthy diet, exercise, weight loss.  Obtain routine lab work. Initially EKG showed poor lead attachment, repeat EKG is personally reviewed and is normal.    Skin lesions- refer to dermatology for skin check.  OSA- refer to pulmonary.  He may benefit from retrial with ramp/different mask.    Elevated blood pressure- discussed low sodium diet. Repeat BP in 3 months.

## 2016-08-11 ENCOUNTER — Encounter: Payer: Self-pay | Admitting: Family

## 2016-08-11 LAB — CBC WITH DIFFERENTIAL/PLATELET
BASOS ABS: 0 10*3/uL (ref 0.0–0.1)
Basophils Relative: 0.6 % (ref 0.0–3.0)
EOS ABS: 0.1 10*3/uL (ref 0.0–0.7)
Eosinophils Relative: 2.3 % (ref 0.0–5.0)
HCT: 46.4 % (ref 39.0–52.0)
Hemoglobin: 15.9 g/dL (ref 13.0–17.0)
LYMPHS ABS: 1.3 10*3/uL (ref 0.7–4.0)
LYMPHS PCT: 23.2 % (ref 12.0–46.0)
MCHC: 34.2 g/dL (ref 30.0–36.0)
MCV: 87.3 fl (ref 78.0–100.0)
MONOS PCT: 8.8 % (ref 3.0–12.0)
Monocytes Absolute: 0.5 10*3/uL (ref 0.1–1.0)
NEUTROS PCT: 65.1 % (ref 43.0–77.0)
Neutro Abs: 3.7 10*3/uL (ref 1.4–7.7)
PLATELETS: 175 10*3/uL (ref 150.0–400.0)
RBC: 5.31 Mil/uL (ref 4.22–5.81)
RDW: 12.7 % (ref 11.5–15.5)
WBC: 5.7 10*3/uL (ref 4.0–10.5)

## 2016-08-18 ENCOUNTER — Ambulatory Visit (INDEPENDENT_AMBULATORY_CARE_PROVIDER_SITE_OTHER): Payer: BLUE CROSS/BLUE SHIELD | Admitting: Pulmonary Disease

## 2016-08-18 ENCOUNTER — Telehealth: Payer: Self-pay | Admitting: Pulmonary Disease

## 2016-08-18 ENCOUNTER — Encounter: Payer: Self-pay | Admitting: Pulmonary Disease

## 2016-08-18 VITALS — BP 148/82 | HR 70 | Ht 74.0 in | Wt 237.8 lb

## 2016-08-18 DIAGNOSIS — G4733 Obstructive sleep apnea (adult) (pediatric): Secondary | ICD-10-CM

## 2016-08-18 NOTE — Telephone Encounter (Signed)
Will send to [facility] to ensure this is being taken care of ASAP. Patient is scheduled for 08/19/16 with Dr Ron Parker office. Referral was placed earlier this afternoon. Thanks.

## 2016-08-18 NOTE — Progress Notes (Signed)
   Subjective:    Patient ID: James Burton, male    DOB: 03/04/1971, 45 y.o.   MRN: [id number]  HPI    Review of Systems  Constitutional: Negative for fever and unexpected weight change.  HENT: Negative for congestion, dental problem, ear pain, nosebleeds, postnasal drip, rhinorrhea, sinus pressure, sneezing, sore throat and trouble swallowing.   Eyes: Negative for redness and itching.  Respiratory: Positive for shortness of breath. Negative for cough, chest tightness and wheezing.   Cardiovascular: Negative for palpitations and leg swelling.  Gastrointestinal: Negative for nausea and vomiting.       Acid heartburn  Genitourinary: Negative for dysuria.  Musculoskeletal: Negative for joint swelling.  Skin: Negative for rash.  Neurological: Negative for headaches.  Hematological: Does not bruise/bleed easily.  Psychiatric/Behavioral: Negative for dysphoric mood. The patient is not nervous/anxious.        Objective:   Physical Exam        Assessment & Plan:

## 2016-08-18 NOTE — Patient Instructions (Signed)
Check with your dentist if you are a candidate for a mandibular advancement device to treat moderate obstructive sleep apnea  Follow up in 6 months

## 2016-08-18 NOTE — Telephone Encounter (Signed)
Spoke with patient - Patient called and made an appointment with Dr Ron Parker for 08/19/17 at 10:50am to be assessed and fitted for a dental appliance for treatment of OSA. Referral placed for records to be sent to their office. Nothing further needed.

## 2016-08-18 NOTE — Progress Notes (Signed)
Past Surgical History He  has a past surgical history that includes Vasectomy (2004).  Allergies  Allergen Reactions  . Sudafed Pe [Phenylephrine] Other (See Comments)    Rash on palms    Family History His family history includes Cancer in his maternal grandfather; Diabetes in his father.  Social History He  reports that he has never smoked. He has never used smokeless tobacco. He reports that he does not drink alcohol or use drugs.  Review of systems Constitutional: Negative for fever and unexpected weight change.  HENT: Negative for congestion, dental problem, ear pain, nosebleeds, postnasal drip, rhinorrhea, sinus pressure, sneezing, sore throat and trouble swallowing.   Eyes: Negative for redness and itching.  Respiratory: Positive for shortness of breath. Negative for cough, chest tightness and wheezing.   Cardiovascular: Negative for palpitations and leg swelling.  Gastrointestinal: Negative for nausea and vomiting.       Acid heartburn  Genitourinary: Negative for dysuria.  Musculoskeletal: Negative for joint swelling.  Skin: Negative for rash.  Neurological: Negative for headaches.  Hematological: Does not bruise/bleed easily.  Psychiatric/Behavioral: Negative for dysphoric mood. The patient is not nervous/anxious.     Current Outpatient Prescriptions on File Prior to Visit  Medication Sig  . cyclobenzaprine (FLEXERIL) 10 MG tablet Take 10 mg by mouth at bedtime as needed for muscle spasms.  . diazepam (VALIUM) 5 MG tablet One tab by mouth 30 minutes prior to MRI (Patient not taking: Reported on 08/18/2016)  . meloxicam (MOBIC) 15 MG tablet Take 1 tablet (15 mg total) by mouth daily. (Patient not taking: Reported on 08/18/2016)  . traMADol (ULTRAM) 50 MG tablet Take 1 tablet (50 mg total) by mouth every 8 (eight) hours as needed. (Patient not taking: Reported on 08/18/2016)   No current facility-administered medications on file prior to visit.     Chief Complaint   Patient presents with  . SLEEP CONSULT    Referred  by Dr Inda Castle. Sleep study about 1.5 years ago. Tried and Failed CPAP - unable to tolerate. Epworth Score: 4    Sleep tests HST 11/11/14 >> AHI 19.1, SaO2 low 81%  Past medical history He  has a past medical history of Bilateral inguinal hernia; OSA (obstructive sleep apnea) (09/25/2014); and Plantar fasciitis.  Vital signs BP (!) 148/82 (BP Location: Left Arm, Cuff Size: Normal)   Pulse 70   Ht 6\' 2"  (1.88 m)   Wt 237 lb 12.8 oz (107.9 kg)   SpO2 98%   BMI 30.53 kg/m   History of Present Illness James Burton is a 45 y.o. male for evaluation of sleep problems.  His wife works in the heart failure clinic.  She has been worried about his sleep.  He snores and stops breathing while asleep.  He will wake up hearing himself snore.  He had sleep study 2 years ago that showed moderate sleep apnea.  He briefly tried CPAP, but was never really able to sleep with it.  His sleep has gotten worse.  He has been told he grinds his teeth.  He doesn't usually sleep on his back.  He goes to sleep at 11 pm.  He falls asleep after an hour.  He wakes up some times to use the bathroom.  He gets out of bed at 630 am.  He feels like staying in bed longer in the morning.  He denies morning headache.  He does not use anything to help him stay awake.  He takes benadryl several times per  week to help him sleep.  He denies sleep walking, sleep talking, or nightmares.  There is no history of restless legs.  He denies sleep hallucinations, sleep paralysis, or cataplexy.  The Epworth score is 4 out of 24.   Physical Exam:  General - No distress ENT - No sinus tenderness, no oral exudate, no LAN, no thyromegaly, TM clear, pupils equal/reactive, MP 2, high arched palate Cardiac - s1s2 regular, no murmur, pulses symmetric Chest - No wheeze/rales/dullness, good air entry, normal respiratory excursion Back - No focal tenderness Abd - Soft, non-tender, no  organomegaly, + bowel sounds Ext - No edema Neuro - Normal strength, cranial nerves intact Skin - No rashes Psych - Normal mood, and behavior  Discussion: He has snoring, sleep disruption, apnea, and daytime sleepiness.  Prior sleep study showed moderate sleep apnea.  He briefly tried CPAP, but isn't sure he could tolerate this.  We discussed how sleep apnea can affect various health problems, including risks for hypertension, cardiovascular disease, and diabetes.  We also discussed how sleep disruption can increase risks for accidents, such as while driving.  Weight loss as a means of improving sleep apnea was also reviewed.  Additional treatment options discussed were CPAP therapy, oral appliance, and surgical intervention.  Assessment/plan:  Obstructive sleep apnea. - he will check with his dentist about getting an oral appliance - explained he might need repeat sleep study for insurance coverage - he will call if he needs helps getting oral appliance set up or if he would like to try CPAP instead   Patient Instructions  Check with your dentist if you are a candidate for a mandibular advancement device to treat moderate obstructive sleep apnea  Follow up in 6 months    Chesley Mires, M.D. Pager [phone] 08/18/2016, 1:58 PM

## 2016-08-19 ENCOUNTER — Telehealth: Payer: Self-pay | Admitting: Pulmonary Disease

## 2016-08-19 NOTE — Telephone Encounter (Signed)
Referral has been sent to dr Ron Parker that office will call the pt James Burton

## 2016-08-19 NOTE — Telephone Encounter (Signed)
Call dr Ron Parker office spoke to [place] sent her demographics insurance info and sleep study for pt to be seen today James Burton

## 2016-09-21 DIAGNOSIS — G4733 Obstructive sleep apnea (adult) (pediatric): Secondary | ICD-10-CM | POA: Diagnosis not present

## 2016-09-23 DIAGNOSIS — L578 Other skin changes due to chronic exposure to nonionizing radiation: Secondary | ICD-10-CM | POA: Diagnosis not present

## 2016-09-23 DIAGNOSIS — L570 Actinic keratosis: Secondary | ICD-10-CM | POA: Diagnosis not present

## 2016-09-23 DIAGNOSIS — D1801 Hemangioma of skin and subcutaneous tissue: Secondary | ICD-10-CM | POA: Diagnosis not present

## 2016-09-23 DIAGNOSIS — L820 Inflamed seborrheic keratosis: Secondary | ICD-10-CM | POA: Diagnosis not present

## 2016-09-23 DIAGNOSIS — L821 Other seborrheic keratosis: Secondary | ICD-10-CM | POA: Diagnosis not present

## 2016-09-23 DIAGNOSIS — L738 Other specified follicular disorders: Secondary | ICD-10-CM | POA: Diagnosis not present

## 2016-11-10 ENCOUNTER — Ambulatory Visit (INDEPENDENT_AMBULATORY_CARE_PROVIDER_SITE_OTHER): Payer: BLUE CROSS/BLUE SHIELD | Admitting: Family

## 2016-11-10 ENCOUNTER — Encounter: Payer: Self-pay | Admitting: Family

## 2016-11-10 ENCOUNTER — Telehealth: Payer: Self-pay | Admitting: *Deleted

## 2016-11-10 VITALS — BP 116/78 | HR 66 | Temp 98.0°F | Resp 16 | Ht 73.5 in | Wt 233.6 lb

## 2016-11-10 DIAGNOSIS — I10 Essential (primary) hypertension: Secondary | ICD-10-CM

## 2016-11-10 DIAGNOSIS — G4733 Obstructive sleep apnea (adult) (pediatric): Secondary | ICD-10-CM | POA: Diagnosis not present

## 2016-11-10 NOTE — Patient Instructions (Signed)
We will request a copy of your sleep study from Dr. Ron Parker.   In the meantime, please continue your oral appliance.

## 2016-11-10 NOTE — Progress Notes (Signed)
Subjective:    Patient ID: James Burton, male    DOB: 1971-06-21, 46 y.o.   MRN: [id number]  HPI  James Burton is a 46 yr old male who presents today for follow up of his blood pressure.  HTN- last visit BP was elevated. Follow up bp looks good.  BP Readings from Last 3 Encounters:  11/10/16 116/78  08/18/16 (!) 148/82  08/10/16 136/80   OSA- recently obtain an oral appliance.  Reports that he had a follow up sleep study with Dr. Ron Parker (orthodontist). Reports that snoring is improved but not resolved. Still feels sluggish in the AM. He is also working with Dr. Halford Chessman for his [place].   He had some skin lesions removed by dermatology and reports that they were benign. Review of Systems See HPI  Past Medical History:  Diagnosis Date  . Bilateral inguinal hernia   . OSA (obstructive sleep apnea) 09/25/2014   Moderate OSA per home study 3/16   . Plantar fasciitis      Social History   Social History  . Marital status: Married    Spouse name: N/A  . Number of children: N/A  . Years of education: N/A   Occupational History  . Patent examiner    Social History Main Topics  . Smoking status: Never Smoker  . Smokeless tobacco: Never Used  . Alcohol use No  . Drug use: No  . Sexual activity: Not on file   Other Topics Concern  . Not on file   Social History Narrative   2 boys- ages 60 and 48   Married   Enjoys Designer, fashion/clothing   Completed 4 yrs of Media planner schooling   Works as Facilities manager    Past Surgical History:  Procedure Laterality Date  . VASECTOMY  2004    Family History  Problem Relation Age of Onset  . Diabetes Father   . Cancer Maternal Grandfather     bone, skin    Allergies  Allergen Reactions  . Sudafed Pe [Phenylephrine] Other (See Comments)    Rash on palms    Current Outpatient Prescriptions on File Prior to Visit  Medication Sig Dispense Refill  . diphenhydrAMINE (BENADRYL) 25 MG tablet Take 25 mg by mouth daily.    . cyclobenzaprine  (FLEXERIL) 10 MG tablet Take 10 mg by mouth at bedtime as needed for muscle spasms.    . traMADol (ULTRAM) 50 MG tablet Take 1 tablet (50 mg total) by mouth every 8 (eight) hours as needed. (Patient not taking: Reported on 08/18/2016) 30 tablet 0   No current facility-administered medications on file prior to visit.     BP 116/78 (BP Location: Left Arm, Cuff Size: Large)   Pulse 66   Temp 98 F (36.7 C) (Oral)   Resp 16   Ht 6' 1.5" (1.867 m)   Wt 233 lb 9.6 oz (106 kg)   SpO2 98% Comment: room air  BMI 30.40 kg/m       Objective:   Physical Exam  Constitutional: He is oriented to person, place, and time. He appears well-developed and well-nourished. No distress.  HENT:  Head: Normocephalic and atraumatic.  Cardiovascular: Normal rate and regular rhythm.   No murmur heard. Pulmonary/Chest: Effort normal and breath sounds normal. No respiratory distress. He has no wheezes. He has no rales.  Musculoskeletal: He exhibits no edema.  Neurological: He is alert and oriented to person, place, and time.  Skin: Skin is warm and dry.  Psychiatric:  He has a normal mood and affect. His behavior is normal. Thought content normal.          Assessment & Plan:  Elevated blood pressure reading- Follow up BP looks great today.  Monitor.

## 2016-11-10 NOTE — Assessment & Plan Note (Signed)
Will request follow up sleep study report from Dr. Ron Parker.

## 2016-11-10 NOTE — Progress Notes (Signed)
Pre visit review using our clinic review tool, if applicable. No additional management support is needed unless otherwise documented below in the visit note. 

## 2016-11-10 NOTE — Telephone Encounter (Signed)
Sleep study report received and forwarded to PCP for review.  Please advise?

## 2016-11-10 NOTE — Telephone Encounter (Signed)
Pt was seen in the office today and reports sleep study done 1.5 weeks ago with orthodontist, Dr Ron Parker. Spoke with Clarise Cruz at [phone] and requested result be faxed to the nurses station. Awaiting report.

## 2016-11-12 ENCOUNTER — Telehealth: Payer: Self-pay | Admitting: Family

## 2016-11-21 NOTE — Telephone Encounter (Signed)
Opened in error

## 2016-11-26 ENCOUNTER — Encounter: Payer: Self-pay | Admitting: Family

## 2017-03-07 DIAGNOSIS — M5412 Radiculopathy, cervical region: Secondary | ICD-10-CM | POA: Diagnosis not present

## 2017-03-14 ENCOUNTER — Other Ambulatory Visit: Payer: Self-pay | Admitting: Neurological Surgery

## 2017-03-14 DIAGNOSIS — M5412 Radiculopathy, cervical region: Secondary | ICD-10-CM

## 2017-03-23 ENCOUNTER — Ambulatory Visit
Admission: RE | Admit: 2017-03-23 | Discharge: 2017-03-23 | Disposition: A | Discharge status: Home / Self Care | Payer: BLUE CROSS/BLUE SHIELD | Source: Ambulatory Visit | Attending: Neurological Surgery | Admitting: Neurological Surgery

## 2017-03-23 DIAGNOSIS — M5412 Radiculopathy, cervical region: Secondary | ICD-10-CM

## 2017-03-23 DIAGNOSIS — M47812 Spondylosis without myelopathy or radiculopathy, cervical region: Secondary | ICD-10-CM | POA: Diagnosis not present

## 2017-03-23 MED ORDER — TRIAMCINOLONE ACETONIDE 40 MG/ML IJ SUSP (RADIOLOGY)
40.0000 mg | Freq: Once | INTRAMUSCULAR | Status: AC
  Administered 2017-03-23: 60 mg via EPIDURAL

## 2017-03-23 MED ORDER — DIAZEPAM 5 MG PO TABS
10.0000 mg | ORAL_TABLET | Freq: Once | ORAL | Status: AC
  Administered 2017-03-23: 10 mg via ORAL

## 2017-03-23 MED ORDER — IOPAMIDOL (ISOVUE-M 300) INJECTION 61%
1.0000 mL | Freq: Once | INTRAMUSCULAR | Status: AC | PRN
  Administered 2017-03-23: 1 mL via EPIDURAL

## 2017-03-23 NOTE — Discharge Instructions (Signed)

## 2017-08-12 ENCOUNTER — Ambulatory Visit (INDEPENDENT_AMBULATORY_CARE_PROVIDER_SITE_OTHER): Payer: BLUE CROSS/BLUE SHIELD | Admitting: Family

## 2017-08-12 ENCOUNTER — Telehealth: Payer: Self-pay | Admitting: Family

## 2017-08-12 ENCOUNTER — Encounter: Payer: Self-pay | Admitting: Family

## 2017-08-12 VITALS — BP 130/85 | HR 64 | Temp 97.9°F | Resp 26 | Ht 73.5 in | Wt 235.6 lb

## 2017-08-12 DIAGNOSIS — Z0000 Encounter for general adult medical examination without abnormal findings: Secondary | ICD-10-CM | POA: Diagnosis not present

## 2017-08-12 DIAGNOSIS — E875 Hyperkalemia: Secondary | ICD-10-CM

## 2017-08-12 LAB — LIPID PANEL
CHOL/HDL RATIO: 7
CHOLESTEROL: 176 mg/dL (ref 0–200)
HDL: 27 mg/dL — AB (ref >39.00)
LDL CALC: 125 mg/dL — AB (ref 0–99)
NonHDL: 148.76
TRIGLYCERIDES: 117 mg/dL (ref 0.0–149.0)
VLDL: 23.4 mg/dL (ref 0.0–40.0)

## 2017-08-12 LAB — URINALYSIS, ROUTINE W REFLEX MICROSCOPIC
Bilirubin Urine: NEGATIVE
Hgb urine dipstick: NEGATIVE
Ketones, ur: NEGATIVE
Leukocytes, UA: NEGATIVE
NITRITE: NEGATIVE
PH: 6 (ref 5.0–8.0)
RBC / HPF: NONE SEEN (ref 0–?)
SPECIFIC GRAVITY, URINE: 1.025 (ref 1.000–1.030)
Total Protein, Urine: NEGATIVE
Urine Glucose: NEGATIVE
Urobilinogen, UA: 0.2 (ref 0.0–1.0)
WBC UA: NONE SEEN (ref 0–?)

## 2017-08-12 LAB — CBC WITH DIFFERENTIAL/PLATELET
BASOS PCT: 0.9 % (ref 0.0–3.0)
Basophils Absolute: 0 10*3/uL (ref 0.0–0.1)
EOS ABS: 0.2 10*3/uL (ref 0.0–0.7)
Eosinophils Relative: 3.3 % (ref 0.0–5.0)
HEMATOCRIT: 47.7 % (ref 39.0–52.0)
Hemoglobin: 15.8 g/dL (ref 13.0–17.0)
LYMPHS ABS: 1.3 10*3/uL (ref 0.7–4.0)
LYMPHS PCT: 26.5 % (ref 12.0–46.0)
MCHC: 33.2 g/dL (ref 30.0–36.0)
MCV: 89.7 fl (ref 78.0–100.0)
Monocytes Absolute: 0.4 10*3/uL (ref 0.1–1.0)
Monocytes Relative: 8.5 % (ref 3.0–12.0)
NEUTROS ABS: 2.9 10*3/uL (ref 1.4–7.7)
NEUTROS PCT: 60.8 % (ref 43.0–77.0)
Platelets: 181 10*3/uL (ref 150.0–400.0)
RBC: 5.31 Mil/uL (ref 4.22–5.81)
RDW: 12.7 % (ref 11.5–15.5)
WBC: 4.8 10*3/uL (ref 4.0–10.5)

## 2017-08-12 LAB — BASIC METABOLIC PANEL
BUN: 21 mg/dL (ref 6–23)
CHLORIDE: 107 meq/L (ref 96–112)
CO2: 31 meq/L (ref 19–32)
CREATININE: 1.29 mg/dL (ref 0.40–1.50)
Calcium: 9.9 mg/dL (ref 8.4–10.5)
GFR: 63.56 mL/min (ref >60.00)
GLUCOSE: 114 mg/dL — AB (ref 70–99)
Potassium: 5.3 mEq/L — ABNORMAL HIGH (ref 3.5–5.1)
Sodium: 144 mEq/L (ref 135–145)

## 2017-08-12 LAB — HEPATIC FUNCTION PANEL
ALK PHOS: 95 U/L (ref 39–117)
ALT: 41 U/L (ref 0–53)
AST: 23 U/L (ref 0–37)
Albumin: 4.7 g/dL (ref 3.5–5.2)
BILIRUBIN TOTAL: 0.7 mg/dL (ref 0.2–1.2)
Bilirubin, Direct: 0.1 mg/dL (ref 0.0–0.3)
Total Protein: 7.3 g/dL (ref 6.0–8.3)

## 2017-08-12 LAB — TSH: TSH: 1.89 u[IU]/mL (ref 0.35–4.50)

## 2017-08-12 MED ORDER — ALPRAZOLAM 0.5 MG PO TABS
ORAL_TABLET | ORAL | 0 refills | Status: DC
Start: 2017-08-12 — End: 2018-02-13

## 2017-08-12 NOTE — Telephone Encounter (Signed)
K+ was mildly elevated.  Could be that the blood hemolyzed.  Let's repeat his bmet in 1 week. Dx hyperkalemia.  HDL "good cholesterol" is low, exercise can help raise HDL. Sugar is mildly elevated. Let's add on A1c to see how it has been averaging.  Continue to work on [employer] as we discussed during his visit. Other lab work looks good.

## 2017-08-12 NOTE — Patient Instructions (Signed)
Please add 30 minutes of cardio 5 days a week. Work on avoiding fast food and limiting your sodium intake.

## 2017-08-12 NOTE — Progress Notes (Addendum)
Subjective:    Patient ID: James Burton, male    DOB: [birth year], 46 y.o.   MRN: [id number]  HPI  Patient presents today for complete physical.  Immunizations: Declines flu shot. Tetanus up to date Diet: fair diet,  Wt Readings from Last 3 Encounters:  08/12/17 235 lb 9.6 oz (106.9 kg)  11/10/16 233 lb 9.6 oz (106 kg)  08/18/16 237 lb 12.8 oz (107.9 kg)  Exercise: very little formal exercise Dental: up to date Vision:  Up to date    BP Readings from Last 3 Encounters:  08/12/17 130/85  03/23/17 140/76  11/10/16 116/78    Review of Systems  Constitutional: Negative for unexpected weight change.  HENT: Negative for hearing loss and rhinorrhea.   Eyes: Negative for visual disturbance.  Respiratory: Negative for cough.   Cardiovascular: Negative for leg swelling.  Gastrointestinal: Negative for constipation and diarrhea.  Genitourinary: Negative for dysuria and frequency.  Musculoskeletal: Negative for arthralgias and myalgias.  Skin: Negative for rash.  Neurological: Negative for headaches.  Hematological: Negative for adenopathy.  Psychiatric/Behavioral:       Denies depression/anxiety       Past Medical History:  Diagnosis Date  . Bilateral inguinal hernia   . OSA (obstructive sleep apnea) 09/25/2014   Moderate OSA per home study 3/16   . Plantar fasciitis      Social History   Socioeconomic History  . Marital status: Married    Spouse name: Not on file  . Number of children: Not on file  . Years of education: Not on file  . Highest education level: Not on file  Social Needs  . Financial resource strain: Not on file  . Food insecurity - worry: Not on file  . Food insecurity - inability: Not on file  . Transportation needs - medical: Not on file  . Transportation needs - non-medical: Not on file  Occupational History  . Occupation: Patent examiner  Tobacco Use  . Smoking status: Never Smoker  . Smokeless tobacco: Never Used  Substance and Sexual  Activity  . Alcohol use: No  . Drug use: No  . Sexual activity: Not on file  Other Topics Concern  . Not on file  Social History Narrative   2 boys- ages 49 and 46   Married   Enjoys Designer, fashion/clothing   Completed 4 yrs of Media planner schooling   Works as Facilities manager    Past Surgical History:  Procedure Laterality Date  . VASECTOMY  2004    Family History  Problem Relation Age of Onset  . Diabetes Father   . Cancer Maternal Grandfather        bone, skin    Allergies  Allergen Reactions  . Sudafed Pe [Phenylephrine] Other (See Comments)    Rash on palms    Current Outpatient Medications on File Prior to Visit  Medication Sig Dispense Refill  . cyclobenzaprine (FLEXERIL) 10 MG tablet Take 10 mg by mouth at bedtime as needed for muscle spasms.    . traMADol (ULTRAM) 50 MG tablet Take 1 tablet (50 mg total) by mouth every 8 (eight) hours as needed. 30 tablet 0   No current facility-administered medications on file prior to visit.     BP 130/85   Pulse 64   Temp 97.9 F (36.6 C) (Oral)   Resp (!) 26   Ht 6' 1.5" (1.867 m)   Wt 235 lb 9.6 oz (106.9 kg)   SpO2 97%   BMI 30.66  kg/m    Objective:   Physical Exam Physical Exam  Constitutional: James Burton is oriented to person, place, and time. James Burton appears well-developed and well-nourished. No distress.  HENT:  Head: Normocephalic and atraumatic.  Right Ear: Tympanic membrane and ear canal normal.  Left Ear: Tympanic membrane and ear canal normal.  Mouth/Throat: Oropharynx is clear and moist.  Eyes: Pupils are equal, round, and reactive to light. No scleral icterus.  Neck: Normal range of motion. No thyromegaly present.  Cardiovascular: Normal rate and regular rhythm.   No murmur heard. Pulmonary/Chest: Effort normal and breath sounds normal. No respiratory distress. James Burton has no wheezes. James Burton has no rales. James Burton exhibits no tenderness.  Abdominal: Soft. Bowel sounds are normal. James Burton exhibits no distension and no mass. There is no  tenderness. There is no rebound and no guarding.  Musculoskeletal: James Burton exhibits no edema.  Lymphadenopathy:    James Burton has no cervical adenopathy.  Neurological: James Burton is alert and oriented to person, place, and time. James Burton has normal patellar reflexes. James Burton exhibits normal muscle tone. Coordination normal.  Skin: Skin is warm and dry.  Psychiatric: James Burton has a normal mood and affect. His behavior is normal. Judgment and thought content normal.           Assessment & Plan:   Preventative care- discussed healthy diet, regular exercise.  Obtain routine lab work, tetanus up to date declines flu shot.  Requests rx for xanax for upcoming flight due to fear of flying. Rx provided for 2 tabs.     initial bp was mildly elevated. Follow up bp OK. Discussed low sodium diet, follow up in 6 months for bp recheck.   Assessment & Plan:  EKG tracing is personally reviewed.  EKG notes NSR- sinus brady.  No acute changes.

## 2017-08-15 ENCOUNTER — Encounter: Payer: Self-pay | Admitting: Family

## 2017-08-15 ENCOUNTER — Other Ambulatory Visit (INDEPENDENT_AMBULATORY_CARE_PROVIDER_SITE_OTHER): Payer: BLUE CROSS/BLUE SHIELD

## 2017-08-15 DIAGNOSIS — R7309 Other abnormal glucose: Secondary | ICD-10-CM | POA: Diagnosis not present

## 2017-08-15 LAB — HEMOGLOBIN A1C: HEMOGLOBIN A1C: 5.3 % (ref 4.6–6.5)

## 2017-08-15 NOTE — Telephone Encounter (Signed)
Pt voices understanding. Lab appt scheduled for 08/19/17 at 4pm and future order entered. Waiting to see if lab can add hgb a1c. Add on form faxed to the lab.

## 2017-08-19 ENCOUNTER — Other Ambulatory Visit (INDEPENDENT_AMBULATORY_CARE_PROVIDER_SITE_OTHER): Payer: BLUE CROSS/BLUE SHIELD

## 2017-08-19 DIAGNOSIS — E875 Hyperkalemia: Secondary | ICD-10-CM

## 2017-08-19 LAB — BASIC METABOLIC PANEL
BUN / CREAT RATIO: 13 (calc) (ref 6–22)
BUN: 18 mg/dL (ref 7–25)
CO2: 28 mmol/L (ref 20–32)
CREATININE: 1.38 mg/dL — AB (ref 0.60–1.35)
Calcium: 9.4 mg/dL (ref 8.6–10.3)
Chloride: 104 mmol/L (ref 98–110)
GLUCOSE: 77 mg/dL (ref 65–99)
Potassium: 4.5 mmol/L (ref 3.5–5.3)
Sodium: 139 mmol/L (ref 135–146)

## 2017-08-19 NOTE — Addendum Note (Signed)
Addended by: Caffie Pinto on: 08/19/2017 10:28 AM   Modules accepted: Orders

## 2017-08-20 ENCOUNTER — Encounter: Payer: Self-pay | Admitting: Family

## 2018-02-10 ENCOUNTER — Ambulatory Visit: Payer: BLUE CROSS/BLUE SHIELD | Admitting: Family

## 2018-02-13 ENCOUNTER — Encounter: Payer: Self-pay | Admitting: Family

## 2018-02-13 ENCOUNTER — Ambulatory Visit (INDEPENDENT_AMBULATORY_CARE_PROVIDER_SITE_OTHER): Payer: BLUE CROSS/BLUE SHIELD | Admitting: Family

## 2018-02-13 VITALS — BP 128/84 | HR 97 | Temp 86.0°F | Resp 16 | Ht 73.0 in | Wt 238.8 lb

## 2018-02-13 DIAGNOSIS — Z111 Encounter for screening for respiratory tuberculosis: Secondary | ICD-10-CM

## 2018-02-13 DIAGNOSIS — R739 Hyperglycemia, unspecified: Secondary | ICD-10-CM | POA: Diagnosis not present

## 2018-02-13 DIAGNOSIS — H547 Unspecified visual loss: Secondary | ICD-10-CM | POA: Diagnosis not present

## 2018-02-13 DIAGNOSIS — R030 Elevated blood-pressure reading, without diagnosis of hypertension: Secondary | ICD-10-CM

## 2018-02-13 NOTE — Progress Notes (Signed)
Subjective:    Patient ID: James Burton, male    DOB: October 22, 1970, 47 y.o.   MRN: [id number]  HPI  Mr. Duross is a 47 yr old male who presents today for follow up of his blood pressure. He is not currently on an antihypertensive. He reports that he woke up with blurry vision one morning. (blurred in the distance in both eyes) Had bp 150/90 at [facility].  He bought a cuff and had similar bp.  Another day bp was 107/78.  Vision has returned to normal.   Had an eye exam 6 months ago.  Was told eyes looked healthy at that time. Vision has returned to normal.   BP Readings from Last 3 Encounters:  02/13/18 128/84  08/12/17 130/85  03/23/17 140/76   Requests TB testing for employer.     Review of Systems See HPI  Past Medical History:  Diagnosis Date  . Bilateral inguinal hernia   . OSA (obstructive sleep apnea) 09/25/2014   Moderate OSA per home study 3/16   . Plantar fasciitis      Social History   Socioeconomic History  . Marital status: Married    Spouse name: Not on file  . Number of children: Not on file  . Years of education: Not on file  . Highest education level: Not on file  Occupational History  . Occupation: Patent examiner  Social Needs  . Financial resource strain: Not on file  . Food insecurity:    Worry: Not on file    Inability: Not on file  . Transportation needs:    Medical: Not on file    Non-medical: Not on file  Tobacco Use  . Smoking status: Never Smoker  . Smokeless tobacco: Never Used  Substance and Sexual Activity  . Alcohol use: No  . Drug use: No  . Sexual activity: Not on file  Lifestyle  . Physical activity:    Days per week: Not on file    Minutes per session: Not on file  . Stress: Not on file  Relationships  . Social connections:    Talks on phone: Not on file    Gets together: Not on file    Attends religious service: Not on file    Active member of club or organization: Not on file    Attends meetings of clubs or  organizations: Not on file    Relationship status: Not on file  . Intimate partner violence:    Fear of current or ex partner: Not on file    Emotionally abused: Not on file    Physically abused: Not on file    Forced sexual activity: Not on file  Other Topics Concern  . Not on file  Social History Narrative   2 boys- ages 98 and 60   Married   Enjoys Designer, fashion/clothing   Completed 4 yrs of Media planner schooling   Works as Facilities manager    Past Surgical History:  Procedure Laterality Date  . VASECTOMY  2004    Family History  Problem Relation Age of Onset  . Diabetes Father   . Cancer Maternal Grandfather        bone, skin    Allergies  Allergen Reactions  . Sudafed Pe [Phenylephrine] Other (See Comments)    Rash on palms    No current outpatient medications on file prior to visit.   No current facility-administered medications on file prior to visit.     BP 128/84 (BP Location:  Left Arm, Patient Position: Sitting, Cuff Size: Large)   Pulse 97   Temp (!) 86 F (30 C) (Oral)   Resp 16   Ht 6\' 1"  (1.854 m)   Wt 238 lb 12.8 oz (108.3 kg)   SpO2 95%   BMI 31.51 kg/m       Objective:   Physical Exam  Constitutional: He is oriented to person, place, and time. He appears well-developed and well-nourished. No distress.  HENT:  Head: Normocephalic and atraumatic.  Eyes: Pupils are equal, round, and reactive to light. Conjunctivae and EOM are normal. Scleral icterus is present.  Cardiovascular: Normal rate and regular rhythm.  No murmur heard. Pulmonary/Chest: Effort normal and breath sounds normal. No respiratory distress. He has no wheezes. He has no rales.  Musculoskeletal: He exhibits no edema.  Neurological: He is alert and oriented to person, place, and time.  Skin: Skin is warm and dry.  Psychiatric: He has a normal mood and affect. His behavior is normal. Thought content normal.          Assessment & Plan:  Vision Problem- resolved.  Will check  glucose.  Lab Results  Component Value Date   HGBA1C 5.3 08/15/2017   Elevated blood pressure reading- I don't think that his home readings were cause for his vision issues. BP looks good today. Advised pt on low sodium diet. Plan to repeat blood pressure in 3 months.  TB testing- check TB gold.

## 2018-02-13 NOTE — Patient Instructions (Signed)
Pleas complete lab work prior to leaving. Call if you develop recurrent vision issues.

## 2018-02-14 LAB — BASIC METABOLIC PANEL
BUN: 22 mg/dL (ref 6–23)
CHLORIDE: 105 meq/L (ref 96–112)
CO2: 29 meq/L (ref 19–32)
CREATININE: 1.28 mg/dL (ref 0.40–1.50)
Calcium: 9.9 mg/dL (ref 8.4–10.5)
GFR: 63.99 mL/min (ref >60.00)
Glucose, Bld: 66 mg/dL — ABNORMAL LOW (ref 70–99)
Potassium: 4.9 mEq/L (ref 3.5–5.1)
SODIUM: 144 meq/L (ref 135–145)

## 2018-02-15 LAB — QUANTIFERON-TB GOLD PLUS
NIL: 0.01 [IU]/mL
QUANTIFERON-TB GOLD PLUS: NEGATIVE
TB1-NIL: 0.01 [IU]/mL
TB2-NIL: 0 IU/mL

## 2018-03-21 ENCOUNTER — Ambulatory Visit: Payer: Self-pay

## 2018-03-21 ENCOUNTER — Ambulatory Visit (INDEPENDENT_AMBULATORY_CARE_PROVIDER_SITE_OTHER): Payer: BLUE CROSS/BLUE SHIELD | Admitting: Family

## 2018-03-21 ENCOUNTER — Telehealth: Payer: Self-pay

## 2018-03-21 ENCOUNTER — Encounter: Payer: Self-pay | Admitting: Family

## 2018-03-21 VITALS — BP 117/78 | HR 55 | Temp 98.2°F | Resp 18 | Ht 73.0 in | Wt 240.6 lb

## 2018-03-21 DIAGNOSIS — R220 Localized swelling, mass and lump, head: Secondary | ICD-10-CM | POA: Diagnosis not present

## 2018-03-21 MED ORDER — RANITIDINE HCL 150 MG PO TABS: 150.0000 mg | ORAL_TABLET | Freq: Two times a day (BID) | ORAL | 0 refills | Status: DC

## 2018-03-21 MED ORDER — EPINEPHRINE 0.3 MG/0.3ML IJ SOAJ: 0.3000 mg | Freq: Once | INTRAMUSCULAR | 0 refills | Status: AC

## 2018-03-21 MED ORDER — PREDNISONE 10 MG PO TABS: ORAL_TABLET | ORAL | 0 refills | Status: DC

## 2018-03-21 MED ORDER — METHYLPREDNISOLONE SODIUM SUCC 125 MG IJ SOLR
125.0000 mg | Freq: Once | INTRAMUSCULAR | Status: AC
  Administered 2018-03-21: 125 mg via INTRAMUSCULAR

## 2018-03-21 MED FILL — EPINEPHRINE 0.3 MG AUTO-INJ: 0.3 | 2 days supply | Qty: 2 | Fill #0

## 2018-03-21 MED FILL — raNITIdine HCL 150 MG TABS: 150 | 7 days supply | Qty: 14 | Fill #0

## 2018-03-21 MED FILL — predniSONE 10 MG TABS: 10 | 5 days supply | Qty: 20 | Fill #0

## 2018-03-21 NOTE — Patient Instructions (Signed)
Please begin prednisone tonight.  Start zantac this afternoon. You may use benadryl 25mg -50mg  every 6 hours as needed for swelling. Go to ER if symptoms worsen. If shortness of breath/tongue swelling occurs please adminster epi pen and call 911.   You will be contacted about your referral to the allergist.

## 2018-03-21 NOTE — Addendum Note (Signed)
Addended by: Kelle Darting A on: 03/21/2018 02:56 PM   Modules accepted: Orders

## 2018-03-21 NOTE — Telephone Encounter (Signed)
Pt. Reports after dinner last night, lower lip started to swell. Inside of lip swollen as well. Took Benadryl 50 mg po and slept well. Still has swelling this morning. No shortness of breath or difficulty swallowing. Appointment made for early afternoon.Instructed if symptoms worsen to go to ED. Verbalizes understanding. Reason for Disposition . Swelling is painful to touch  Answer Assessment - Initial Assessment Questions 1. ONSET: "When did the swelling start?" (e.g., minutes, hours, days)     Last night 7:30 pm 2. SEVERITY: "How swollen is it?"     Moderate 3. ITCHING: "Is there any itching?" If so, ask: "How much?"   (Scale 1-10; mild, moderate or severe)     No 4. PAIN: "Is the swelling painful to touch?" If so, ask: "How painful is it?"   (Scale 1-10; mild, moderate or severe)     Mild 5. CAUSE: "What do you think is causing the lip swelling?"      Unsure 6. RECURRENT SYMPTOM: "Have you had lip swelling before?" If so, ask: "When was the last time?" "What happened that time?"     No 7. OTHER SYMPTOMS: "Do you have any other symptoms?" (e.g., toothache)     No 8. PREGNANCY: "Is there any chance you are pregnant?" "When was your last menstrual period?"     n/a  Protocols used: LIP [facility]

## 2018-03-21 NOTE — Telephone Encounter (Signed)
Author phoned pt to see if he would be able to be seen earlier by Lenna Sciara, NP, approved by [facility]. No answer, unable to leave VM.

## 2018-03-21 NOTE — Progress Notes (Signed)
   Subjective:    Patient ID: James Burton, male    DOB: August 12, 1971, 47 y.o.   MRN: [id number]  HPI  Patient is a 47 yr old male who presents with chief complaint of lip swelling. Reports that swelling began last night. Did not have associated tongue swelling or shortness of breath. Started after he ate dinner (had chicken wings baked with lemon pepper seasoning and rice).  Not on any medications. Took benadryl which wife gave to him.     Review of Systems     Objective:   Physical Exam  Constitutional: He is oriented to person, place, and time. He appears well-developed and well-nourished. No distress.  HENT:  Head: Normocephalic and atraumatic.  + moderate swelling of left lower lip. No tongue swelling   Cardiovascular: Normal rate and regular rhythm.  No murmur heard. Pulmonary/Chest: Effort normal and breath sounds normal. No respiratory distress. He has no wheezes. He has no rales.  Musculoskeletal: He exhibits no edema.  Neurological: He is alert and oriented to person, place, and time.  Skin: Skin is warm and dry.  Psychiatric: He has a normal mood and affect. His behavior is normal. Thought content normal.          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  IM solumedrol 125 today in the office. To be followed by oral prednisone, zantac, benadryl. Refer to allergist for further evaluation for underlying cause. He will also be given an epipen.  Instructions on proper use and need to call 911 after administering are reviewed with the patient.

## 2018-03-22 ENCOUNTER — Telehealth: Payer: Self-pay | Admitting: Family

## 2018-03-22 NOTE — Telephone Encounter (Signed)
See mychart.  

## 2018-04-27 DIAGNOSIS — M5412 Radiculopathy, cervical region: Secondary | ICD-10-CM | POA: Diagnosis not present

## 2018-04-27 DIAGNOSIS — Z6830 Body mass index (BMI) 30.0-30.9, adult: Secondary | ICD-10-CM | POA: Diagnosis not present

## 2018-05-02 ENCOUNTER — Other Ambulatory Visit: Payer: Self-pay | Admitting: Neurological Surgery

## 2018-05-02 DIAGNOSIS — M5412 Radiculopathy, cervical region: Secondary | ICD-10-CM

## 2018-05-07 NOTE — Progress Notes (Signed)
Subjective:    Patient ID: James Burton, male    DOB: 05-Aug-1971, 47 y.o.   MRN: [id number]  HPI  Patient is a 47 yr old male who presents today for follow up.  BP Readings from Last 3 Encounters:  05/08/18 116/74  03/21/18 117/78  02/13/18 128/84    Last visit we evaluated him following an episode of lip swelling. We treated him for angioedema with the following:   IM solumedrol 125, followed by oral prednisone, zantac, benadryl.  He was given an epi-pen. He has had no further episodes since last visit.   OSA-  Did not tolerate CPAP.  Had an oral appliance made but this was uncomfortable and he decided he did not want to wear it.    Review of Systems See HPI  Past Medical History:  Diagnosis Date  . Bilateral inguinal hernia   . OSA (obstructive sleep apnea) 09/25/2014   Moderate OSA per home study 3/16   . Plantar fasciitis      Social History   Socioeconomic History  . Marital status: Married    Spouse name: Not on file  . Number of children: Not on file  . Years of education: Not on file  . Highest education level: Not on file  Occupational History  . Occupation: Patent examiner  Social Needs  . Financial resource strain: Not on file  . Food insecurity:    Worry: Not on file    Inability: Not on file  . Transportation needs:    Medical: Not on file    Non-medical: Not on file  Tobacco Use  . Smoking status: Never Smoker  . Smokeless tobacco: Never Used  Substance and Sexual Activity  . Alcohol use: No  . Drug use: No  . Sexual activity: Not on file  Lifestyle  . Physical activity:    Days per week: Not on file    Minutes per session: Not on file  . Stress: Not on file  Relationships  . Social connections:    Talks on phone: Not on file    Gets together: Not on file    Attends religious service: Not on file    Active member of club or organization: Not on file    Attends meetings of clubs or organizations: Not on file    Relationship status:  Not on file  . Intimate partner violence:    Fear of current or ex partner: Not on file    Emotionally abused: Not on file    Physically abused: Not on file    Forced sexual activity: Not on file  Other Topics Concern  . Not on file  Social History Narrative   2 boys- ages 57 and 68   Married   Enjoys Designer, fashion/clothing   Completed 4 yrs of Media planner schooling   Works as Facilities manager    Past Surgical History:  Procedure Laterality Date  . VASECTOMY  2004    Family History  Problem Relation Age of Onset  . Diabetes Father   . Cancer Maternal Grandfather        bone, skin    Allergies  Allergen Reactions  . Sudafed Pe [Phenylephrine] Other (See Comments)    Rash on palms    Current Outpatient Medications on File Prior to Visit  Medication Sig Dispense Refill  . EPINEPHrine 0.3 mg/0.3 mL IJ SOAJ injection Use as needed for anaphylaxis, allergic reactions     No current facility-administered medications on file prior  to visit.     BP 116/74 (BP Location: Right Arm, Cuff Size: Large)   Pulse 64   Temp 97.6 F (36.4 C) (Oral)   Resp 16   Ht 6\' 1"  (1.854 m)   Wt 230 lb 3.2 oz (104.4 kg)   SpO2 99%   BMI 30.37 kg/m       Objective:   Physical Exam  Constitutional: He is oriented to person, place, and time. He appears well-developed and well-nourished. No distress.  HENT:  Head: Normocephalic and atraumatic.  Cardiovascular: Normal rate and regular rhythm.  No murmur heard. Pulmonary/Chest: Effort normal and breath sounds normal. No respiratory distress. He has no wheezes. He has no rales.  Musculoskeletal: He exhibits no edema.  Neurological: He is alert and oriented to person, place, and time.  Skin: Skin is warm and dry.  Psychiatric: He has a normal mood and affect. His behavior is normal. Thought content normal.          Assessment & Plan:  OSA-  He is working on weight loss. Declines oral appliance, cpap or referral to sleep specialist at this  time. I have asked  Him to let me know if he changes his mind and we discussed long term health effects of untreated OSA.  Hx of elevated blood pressure reading- Blood pressure looks great today. Monitor.   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 Will refer to allergist for further evaluation.

## 2018-05-08 ENCOUNTER — Encounter: Payer: Self-pay | Admitting: Family

## 2018-05-08 ENCOUNTER — Ambulatory Visit (INDEPENDENT_AMBULATORY_CARE_PROVIDER_SITE_OTHER): Payer: BLUE CROSS/BLUE SHIELD | Admitting: Family

## 2018-05-08 VITALS — BP 116/74 | HR 64 | Temp 97.6°F | Resp 16 | Ht 73.0 in | Wt 230.2 lb

## 2018-05-08 DIAGNOSIS — T783XXD Angioneurotic edema, subsequent encounter: Secondary | ICD-10-CM | POA: Diagnosis not present

## 2018-05-08 DIAGNOSIS — R030 Elevated blood-pressure reading, without diagnosis of hypertension: Secondary | ICD-10-CM

## 2018-05-08 DIAGNOSIS — G4733 Obstructive sleep apnea (adult) (pediatric): Secondary | ICD-10-CM | POA: Diagnosis not present

## 2018-05-17 ENCOUNTER — Inpatient Hospital Stay
Admission: RE | Admit: 2018-05-17 | Discharge: 2018-05-17 | Disposition: A | Discharge status: Home / Self Care | Payer: BLUE CROSS/BLUE SHIELD | Source: Ambulatory Visit | Attending: Neurological Surgery | Admitting: Neurological Surgery

## 2018-05-27 DIAGNOSIS — M84374A Stress fracture, right foot, initial encounter for fracture: Secondary | ICD-10-CM | POA: Diagnosis not present

## 2018-05-29 ENCOUNTER — Ambulatory Visit: Payer: BLUE CROSS/BLUE SHIELD | Admitting: Family

## 2018-05-31 ENCOUNTER — Ambulatory Visit
Admission: RE | Admit: 2018-05-31 | Discharge: 2018-05-31 | Disposition: A | Discharge status: Home / Self Care | Payer: BLUE CROSS/BLUE SHIELD | Source: Ambulatory Visit | Attending: Neurological Surgery | Admitting: Neurological Surgery

## 2018-05-31 DIAGNOSIS — M47812 Spondylosis without myelopathy or radiculopathy, cervical region: Secondary | ICD-10-CM | POA: Diagnosis not present

## 2018-05-31 DIAGNOSIS — M5412 Radiculopathy, cervical region: Secondary | ICD-10-CM

## 2018-05-31 MED ORDER — DIAZEPAM 5 MG PO TABS
5.0000 mg | ORAL_TABLET | Freq: Once | ORAL | Status: AC
  Administered 2018-05-31: 5 mg via ORAL

## 2018-05-31 MED ORDER — IOPAMIDOL (ISOVUE-M 300) INJECTION 61%
1.0000 mL | Freq: Once | INTRAMUSCULAR | Status: AC | PRN
  Administered 2018-05-31: 1 mL via EPIDURAL

## 2018-05-31 MED ORDER — TRIAMCINOLONE ACETONIDE 40 MG/ML IJ SUSP (RADIOLOGY)
60.0000 mg | Freq: Once | INTRAMUSCULAR | Status: AC
  Administered 2018-05-31: 60 mg via EPIDURAL

## 2018-06-30 ENCOUNTER — Ambulatory Visit: Payer: BLUE CROSS/BLUE SHIELD | Admitting: Allergy

## 2018-08-23 ENCOUNTER — Ambulatory Visit (INDEPENDENT_AMBULATORY_CARE_PROVIDER_SITE_OTHER): Payer: BLUE CROSS/BLUE SHIELD | Admitting: Family

## 2018-08-23 ENCOUNTER — Encounter: Payer: Self-pay | Admitting: Family

## 2018-08-23 VITALS — BP 107/83 | HR 60 | Temp 98.6°F | Resp 16 | Ht 74.0 in | Wt 231.2 lb

## 2018-08-23 DIAGNOSIS — Z0000 Encounter for general adult medical examination without abnormal findings: Secondary | ICD-10-CM

## 2018-08-23 DIAGNOSIS — Z23 Encounter for immunization: Secondary | ICD-10-CM

## 2018-08-23 DIAGNOSIS — Z0001 Encounter for general adult medical examination with abnormal findings: Secondary | ICD-10-CM | POA: Diagnosis not present

## 2018-08-23 DIAGNOSIS — R21 Rash and other nonspecific skin eruption: Secondary | ICD-10-CM | POA: Diagnosis not present

## 2018-08-23 MED ORDER — NYSTATIN 100000 UNIT/GM EX CREA: 1.0000 "application " | TOPICAL_CREAM | Freq: Two times a day (BID) | CUTANEOUS | 0 refills | Status: DC

## 2018-08-23 MED FILL — NYSTATIN 100,000 UNIT/GM CR: 100000 | 15 days supply | Qty: 30 | Fill #0

## 2018-08-23 NOTE — Patient Instructions (Addendum)
Please begin nystatin cream beneath arms. Call if symptoms worsen or if not improved in 2 weeks.  Complete lab work prior to leaving. Keep up the good work with healthy diet, exercise and weight loss.

## 2018-08-23 NOTE — Progress Notes (Signed)
Morning

## 2018-08-23 NOTE — Progress Notes (Signed)
Subjective:    Patient ID: James Burton, male    DOB: October 07, 1970, 47 y.o.   MRN: [id number]  HPI  Patient presents today for complete physical.  Immunizations: tetanus due. Flu shot up to date.  Diet: improved Wt Readings from Last 3 Encounters:  08/23/18 231 lb 3.2 oz (104.9 kg)  05/08/18 230 lb 3.2 oz (104.4 kg)  03/21/18 240 lb 9.6 oz (109.1 kg)  Exercise: no formal exercise, busy at his job Vision: 2018 Dental:  Up to date  Reports rash underarm   Review of Systems  Constitutional: Negative for unexpected weight change.  HENT: Negative for rhinorrhea.   Eyes: Negative for visual disturbance.  Respiratory: Negative for cough.   Cardiovascular: Negative for leg swelling.  Gastrointestinal: Negative for blood in stool, constipation and diarrhea.  Genitourinary: Negative for dysuria, frequency and hematuria.  Musculoskeletal: Negative for arthralgias and myalgias.  Skin: Positive for rash.  Neurological: Negative for headaches.  Hematological: Negative for adenopathy.  Psychiatric/Behavioral:       Denies depression/anxieyt   Past Medical History:  Diagnosis Date  . Bilateral inguinal hernia   . OSA (obstructive sleep apnea) 09/25/2014   Moderate OSA per home study 3/16   . Plantar fasciitis      Social History   Socioeconomic History  . Marital status: Married    Spouse name: Not on file  . Number of children: Not on file  . Years of education: Not on file  . Highest education level: Not on file  Occupational History  . Occupation: Patent examiner  Social Needs  . Financial resource strain: Not on file  . Food insecurity:    Worry: Not on file    Inability: Not on file  . Transportation needs:    Medical: Not on file    Non-medical: Not on file  Tobacco Use  . Smoking status: Never Smoker  . Smokeless tobacco: Never Used  Substance and Sexual Activity  . Alcohol use: No  . Drug use: No  . Sexual activity: Not on file  Lifestyle  . Physical  activity:    Days per week: Not on file    Minutes per session: Not on file  . Stress: Not on file  Relationships  . Social connections:    Talks on phone: Not on file    Gets together: Not on file    Attends religious service: Not on file    Active member of club or organization: Not on file    Attends meetings of clubs or organizations: Not on file    Relationship status: Not on file  . Intimate partner violence:    Fear of current or ex partner: Not on file    Emotionally abused: Not on file    Physically abused: Not on file    Forced sexual activity: Not on file  Other Topics Concern  . Not on file  Social History Narrative   2 boys- ages 12 and 34   Married   Enjoys Designer, fashion/clothing   Completed 4 yrs of Media planner schooling   Works as Facilities manager    Past Surgical History:  Procedure Laterality Date  . VASECTOMY  2004    Family History  Problem Relation Age of Onset  . Diabetes Father   . Cancer Maternal Grandfather        bone, skin    Allergies  Allergen Reactions  . Sudafed Pe [Phenylephrine] Other (See Comments)    Rash on palms  Current Outpatient Medications on File Prior to Visit  Medication Sig Dispense Refill  . EPINEPHrine 0.3 mg/0.3 mL IJ SOAJ injection Use as needed for anaphylaxis, allergic reactions     No current facility-administered medications on file prior to visit.     BP 107/83 (BP Location: Right Arm, Patient Position: Sitting, Cuff Size: Large)   Pulse 60   Temp 98.6 F (37 C) (Oral)   Resp 16   Ht 6\' 2"  (1.88 m)   Wt 231 lb 3.2 oz (104.9 kg)   SpO2 99%   BMI 29.68 kg/m        Objective:   Physical Exam  Physical Exam  Constitutional: He is oriented to person, place, and time. He appears well-developed and well-nourished. No distress.  HENT:  Head: Normocephalic and atraumatic.  Right Ear: Tympanic membrane and ear canal normal.  Left Ear: Tympanic membrane and ear canal normal.  Mouth/Throat: Oropharynx is clear  and moist.  Eyes: Pupils are equal, round, and reactive to light. No scleral icterus.  Neck: Normal range of motion. No thyromegaly present.  Cardiovascular: Normal rate and regular rhythm.   No murmur heard. Pulmonary/Chest: Effort normal and breath sounds normal. No respiratory distress. He has no wheezes. He has no rales. He exhibits no tenderness.  Abdominal: Soft. Bowel sounds are normal. He exhibits no distension and no mass. There is no tenderness. There is no rebound and no guarding.  Musculoskeletal: He exhibits no edema.  Lymphadenopathy:    He has no cervical adenopathy.  Neurological: He is alert and oriented to person, place, and time. He has normal patellar reflexes. He exhibits normal muscle tone. Coordination normal.  Skin: Skin is warm and dry. erythema noted in bilateral axilla L>R Psychiatric: He has a normal mood and affect. His behavior is normal. Judgment and thought content normal.           Assessment & Plan:   Preventative care- commended pt on healthy diet and weight loss. Flu shot up to date. Due for Td booster today.  Obtain routine lab work. EKG tracing is personally reviewed.  EKG notes NSR.  No acute changes.    Skin rash- suspect fungal- he has tried switching deodorants without improvement. Trial of nystatin cream.       Assessment & Plan:

## 2018-08-28 ENCOUNTER — Telehealth: Payer: Self-pay | Admitting: Family

## 2018-08-28 NOTE — Telephone Encounter (Signed)
See my chart message

## 2019-01-05 ENCOUNTER — Encounter: Payer: Self-pay | Admitting: Family

## 2019-01-05 DIAGNOSIS — G4733 Obstructive sleep apnea (adult) (pediatric): Secondary | ICD-10-CM

## 2019-01-09 NOTE — Addendum Note (Signed)
Addended by: [name] on: 01/09/2019 09:25 AM   Modules accepted: Orders

## 2019-01-16 ENCOUNTER — Telehealth: Payer: Self-pay | Admitting: Family

## 2019-01-16 DIAGNOSIS — G4733 Obstructive sleep apnea (adult) (pediatric): Secondary | ICD-10-CM

## 2019-01-16 NOTE — Telephone Encounter (Signed)
See mychart.  

## 2019-01-19 NOTE — Telephone Encounter (Signed)
Spoke with patient. Advised him that we will need updated home sleep study to submit to his insurance in order to get approval for home cpap. Advised pt that an order has been placed and he should be contacted to schedule.    Also, he will look into the out of pocket cost for keeping his cpap machine and may need to return it until insurance approves.

## 2019-01-26 DIAGNOSIS — G4733 Obstructive sleep apnea (adult) (pediatric): Secondary | ICD-10-CM | POA: Diagnosis not present

## 2019-02-26 DIAGNOSIS — G4733 Obstructive sleep apnea (adult) (pediatric): Secondary | ICD-10-CM | POA: Diagnosis not present

## 2019-03-28 DIAGNOSIS — G4733 Obstructive sleep apnea (adult) (pediatric): Secondary | ICD-10-CM | POA: Diagnosis not present

## 2019-04-28 DIAGNOSIS — G4733 Obstructive sleep apnea (adult) (pediatric): Secondary | ICD-10-CM | POA: Diagnosis not present

## 2019-05-29 DIAGNOSIS — G4733 Obstructive sleep apnea (adult) (pediatric): Secondary | ICD-10-CM | POA: Diagnosis not present

## 2019-06-28 DIAGNOSIS — G4733 Obstructive sleep apnea (adult) (pediatric): Secondary | ICD-10-CM | POA: Diagnosis not present

## 2019-07-19 DIAGNOSIS — D225 Melanocytic nevi of trunk: Secondary | ICD-10-CM | POA: Diagnosis not present

## 2019-07-19 DIAGNOSIS — L820 Inflamed seborrheic keratosis: Secondary | ICD-10-CM | POA: Diagnosis not present

## 2019-07-19 DIAGNOSIS — L821 Other seborrheic keratosis: Secondary | ICD-10-CM | POA: Diagnosis not present

## 2019-07-19 DIAGNOSIS — L812 Freckles: Secondary | ICD-10-CM | POA: Diagnosis not present

## 2019-07-19 DIAGNOSIS — D1801 Hemangioma of skin and subcutaneous tissue: Secondary | ICD-10-CM | POA: Diagnosis not present

## 2019-07-29 DIAGNOSIS — G4733 Obstructive sleep apnea (adult) (pediatric): Secondary | ICD-10-CM | POA: Diagnosis not present

## 2019-08-01 DIAGNOSIS — M5412 Radiculopathy, cervical region: Secondary | ICD-10-CM | POA: Diagnosis not present

## 2019-08-02 ENCOUNTER — Other Ambulatory Visit: Payer: Self-pay | Admitting: Neurological Surgery

## 2019-08-02 DIAGNOSIS — M5412 Radiculopathy, cervical region: Secondary | ICD-10-CM

## 2019-08-04 ENCOUNTER — Encounter: Payer: Self-pay | Admitting: Emergency Medicine

## 2019-08-04 ENCOUNTER — Other Ambulatory Visit: Payer: Self-pay

## 2019-08-04 ENCOUNTER — Ambulatory Visit
Admission: EM | Admit: 2019-08-04 | Discharge: 2019-08-04 | Disposition: A | Discharge status: Home / Self Care | Payer: BC Managed Care – PPO | Attending: Emergency Medicine | Admitting: Emergency Medicine

## 2019-08-04 DIAGNOSIS — Z20828 Contact with and (suspected) exposure to other viral communicable diseases: Secondary | ICD-10-CM

## 2019-08-04 DIAGNOSIS — R059 Cough, unspecified: Secondary | ICD-10-CM

## 2019-08-04 DIAGNOSIS — R05 Cough: Secondary | ICD-10-CM

## 2019-08-04 DIAGNOSIS — Z20822 Contact with and (suspected) exposure to covid-19: Secondary | ICD-10-CM

## 2019-08-04 NOTE — ED Notes (Signed)
Patient able to ambulate independently  

## 2019-08-04 NOTE — ED Provider Notes (Signed)
[name]    CSN: [id number] Arrival date & time: 08/04/19  [id number]      History   Chief Complaint Chief Complaint  Patient presents with  . COVID Test    HPI James Burton is a 48 y.o. male with history of OSA presenting for Covid testing.  States a friend/coworker of his tested positive on Monday this week.  Last exposure was Monday 11/16.  Patient does endorse rhinorrhea, dry cough that is chronic/stable since labor day.  Patient tried Delsym with some relief.  Does endorse history of GERD, which he has not been taking his daily taking medication for.    Past Medical History:  Diagnosis Date  . Bilateral inguinal hernia   . OSA (obstructive sleep apnea) 09/25/2014   Moderate OSA per home study 3/16   . Plantar fasciitis     Patient Active Problem List   Diagnosis Date Noted  . Plantar fasciitis 01/29/2016  . Cervical pain (neck) 03/07/2015  . OSA (obstructive sleep apnea) 09/25/2014  . Routine general medical examination at a health care facility 03/19/2014    Past Surgical History:  Procedure Laterality Date  . VASECTOMY  2004       Home Medications    Prior to Admission medications   Medication Sig Start Date End Date Taking? Authorizing Provider  EPINEPHrine 0.3 mg/0.3 mL IJ SOAJ injection Use as needed for anaphylaxis, allergic reactions 03/21/18   [provider]    Family History Family History  Problem Relation Age of Onset  . Diabetes Father   . Cancer Maternal Grandfather        bone, skin    Social History Social History   Tobacco Use  . Smoking status: Never Smoker  . Smokeless tobacco: Never Used  Substance Use Topics  . Alcohol use: No  . Drug use: No     Allergies   Sudafed pe [phenylephrine]   Review of Systems Review of Systems  Constitutional: Negative for fatigue and fever.  HENT: Positive for rhinorrhea. Negative for congestion, dental problem, ear pain, facial swelling, hearing loss, sinus pain,  sore throat, trouble swallowing and voice change.   Eyes: Negative for photophobia, pain and visual disturbance.  Respiratory: Positive for cough. Negative for shortness of breath and wheezing.   Cardiovascular: Negative for chest pain and palpitations.  Gastrointestinal: Negative for diarrhea and vomiting.  Musculoskeletal: Negative for arthralgias and myalgias.  Neurological: Negative for dizziness and headaches.     Physical Exam Triage Vital Signs ED Triage Vitals  Enc Vitals Group     BP      Pulse      Resp      Temp      Temp src      SpO2      Weight      Height      Head Circumference      Peak Flow      Pain Score      Pain Loc      Pain Edu?      Excl. in [place]?    No data found.  Updated Vital Signs BP 132/86 (BP Location: Left Arm)   Pulse 71   Temp 97.9 F (36.6 C) (Temporal)   Resp 18   SpO2 96%   Visual Acuity Right Eye Distance:   Left Eye Distance:   Bilateral Distance:    Right Eye Near:   Left Eye Near:    Bilateral Near:  Physical Exam Constitutional:      General: He is not in acute distress. HENT:     Head: Normocephalic and atraumatic.  Eyes:     General: No scleral icterus.    Pupils: Pupils are equal, round, and reactive to light.  Cardiovascular:     Rate and Rhythm: Normal rate.  Pulmonary:     Effort: Pulmonary effort is normal. No respiratory distress.     Breath sounds: No wheezing.  Skin:    Coloration: Skin is not jaundiced or pale.  Neurological:     Mental Status: He is alert and oriented to person, place, and time.      UC Treatments / Results  Labs (all labs ordered are listed, but only abnormal results are displayed) Labs Reviewed  NOVEL CORONAVIRUS, NAA    EKG   Radiology No results found.  Procedures Procedures (including critical care time)  Medications Ordered in UC Medications - No data to display  Initial Impression / Assessment and Plan / UC Course  I have reviewed the triage vital  signs and the nursing notes.  Pertinent labs & imaging results that were available during my care of the patient were reviewed by me and considered in my medical decision making (see chart for details).     PCR Covid test obtained in office which patient tolerated well: Patient quarantine until results are back.  Patient to resume daily GERD medication to see if this helps with cough, otherwise follow-up with PCP.  Return precautions discussed, patient verbalized understanding and is agreeable to plan. Final Clinical Impressions(s) / UC Diagnoses   Final diagnoses:  Exposure to COVID-19 virus  Cough     Discharge Instructions     Your COVID test is pending - it is important to quarantine / isolate at home until your results are back. If you test positive and would like further evaluation for persistent or worsening symptoms, you may schedule an E-visit or virtual (video) visit throughout the [facility] app or website.  PLEASE NOTE: If you develop severe chest pain or shortness of breath please go to the ER or call [phone] for further evaluation --> DO NOT schedule electronic or virtual visits for this. Please call our office for further guidance / recommendations as needed.    ED Prescriptions    None     PDMP not reviewed this encounter.   [place], [place], [place] 08/04/19 [phone]

## 2019-08-04 NOTE — Discharge Instructions (Addendum)
Your COVID test is pending - it is important to quarantine / isolate at home until your results are back. °If you test positive and would like further evaluation for persistent or worsening symptoms, you may schedule an E-visit or virtual (video) visit throughout the [facility] MyChart app or website. ° °PLEASE NOTE: If you develop severe chest pain or shortness of breath please go to the ER or call 9-1-1 for further evaluation --> DO NOT schedule electronic or virtual visits for this. °Please call our office for further guidance / recommendations as needed. °

## 2019-08-04 NOTE — ED Triage Notes (Signed)
Pt presents to [facility] for assessment after an exposure to a COVID positive friend on Monday of this week.  Pt c/o sinus drainage and cough in the morning and at night starting labor day of this year.

## 2019-08-06 LAB — NOVEL CORONAVIRUS, NAA: SARS-CoV-2, NAA: NOT DETECTED

## 2019-08-17 ENCOUNTER — Inpatient Hospital Stay: Admission: RE | Admit: 2019-08-17 | Payer: BLUE CROSS/BLUE SHIELD | Source: Ambulatory Visit

## 2019-08-22 ENCOUNTER — Other Ambulatory Visit: Payer: Self-pay

## 2019-08-22 ENCOUNTER — Ambulatory Visit
Admission: RE | Admit: 2019-08-22 | Discharge: 2019-08-22 | Disposition: A | Discharge status: Home / Self Care | Payer: BC Managed Care – PPO | Source: Ambulatory Visit | Attending: Neurological Surgery | Admitting: Neurological Surgery

## 2019-08-22 DIAGNOSIS — M5412 Radiculopathy, cervical region: Secondary | ICD-10-CM

## 2019-08-22 DIAGNOSIS — M542 Cervicalgia: Secondary | ICD-10-CM | POA: Diagnosis not present

## 2019-08-22 MED ORDER — DIAZEPAM 5 MG PO TABS
10.0000 mg | ORAL_TABLET | Freq: Once | ORAL | Status: AC
  Administered 2019-08-22: 5 mg via ORAL

## 2019-08-22 NOTE — Discharge Instructions (Signed)

## 2019-08-28 DIAGNOSIS — G4733 Obstructive sleep apnea (adult) (pediatric): Secondary | ICD-10-CM | POA: Diagnosis not present

## 2019-08-30 ENCOUNTER — Other Ambulatory Visit: Payer: Self-pay

## 2019-08-30 ENCOUNTER — Ambulatory Visit
Admission: EM | Admit: 2019-08-30 | Discharge: 2019-08-30 | Disposition: A | Discharge status: Home / Self Care | Payer: BC Managed Care – PPO | Attending: Physician Assistant | Admitting: Physician Assistant

## 2019-08-30 ENCOUNTER — Encounter: Payer: Self-pay | Admitting: Physician Assistant

## 2019-08-30 DIAGNOSIS — R509 Fever, unspecified: Secondary | ICD-10-CM | POA: Diagnosis not present

## 2019-08-30 DIAGNOSIS — Z20828 Contact with and (suspected) exposure to other viral communicable diseases: Secondary | ICD-10-CM | POA: Diagnosis not present

## 2019-08-30 LAB — POCT INFLUENZA A/B
Influenza A, POC: NEGATIVE
Influenza B, POC: NEGATIVE

## 2019-08-30 LAB — POC SARS CORONAVIRUS 2 AG -  ED: SARS Coronavirus 2 Ag: NEGATIVE

## 2019-08-30 LAB — POCT FASTING CBG KUC MANUAL ENTRY: POCT Glucose (KUC): 122 mg/dL — AB (ref 70–99)

## 2019-08-30 MED ORDER — ACETAMINOPHEN 325 MG PO TABS
975.0000 mg | ORAL_TABLET | Freq: Once | ORAL | Status: AC
  Administered 2019-08-30: 975 mg via ORAL

## 2019-08-30 NOTE — ED Notes (Signed)
Patient able to ambulate independently.  Pt encouraged to take Ibuprofen if fever not resolved 1-2 hours after tylenol.  Patient also encouraged to report to the ER the fever reached 103, or if he is unable to get it to come down with medicines.

## 2019-08-30 NOTE — ED Triage Notes (Signed)
Pt presents to [facility] for assessment of 2 days of fever, cough, body aches, headache when he coughs, ringing his ears, decreased appetite.  Denies n/v/d.

## 2019-08-30 NOTE — Discharge Instructions (Addendum)
Rapid flu negative. Rapid COVID negative. COVID PCR testing ordered. I would like you to quarantine until testing results. Tylenol/motrin for fever. You can take over the counter flonase/nasacort to help with nasal congestion/drainage. If experiencing shortness of breath, trouble breathing, go to the emergency department for further evaluation needed.

## 2019-08-30 NOTE — ED Provider Notes (Signed)
[facility]    CSN: [id number] Arrival date & time: 08/30/19  1752      History   Chief Complaint Chief Complaint  Patient presents with   COVID-Like Symptoms    HPI LAIRD WEINHEIMER is a 48 y.o. male.   48 year old male comes in for 2 day history of URI symptoms. Nonproductive cough, rhinorrhea. Fever, chills, body aches. Tmax 101.6. Denies abdominal pain, nausea, vomiting, diarrhea. Denies loss of taste/smell. Feels short of breath, where he feels like he needs to take deep breaths. Never smoker. 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     Past Medical History:  Diagnosis Date   Bilateral inguinal hernia    OSA (obstructive sleep apnea) 09/25/2014   Moderate OSA per home study 3/16    Plantar fasciitis     Patient Active Problem List   Diagnosis Date Noted   Plantar fasciitis 01/29/2016   Cervical pain (neck) 03/07/2015   OSA (obstructive sleep apnea) 09/25/2014   Routine general medical examination at a health care facility 03/19/2014    Past Surgical History:  Procedure Laterality Date   VASECTOMY  2004       Home Medications    Prior to Admission medications   Medication Sig Start Date End Date Taking? Authorizing Provider  cyclobenzaprine (FLEXERIL) 10 MG tablet Take 10 mg by mouth every 6 (six) hours as needed. 08/01/19  Yes [provider]  traMADol (ULTRAM) 50 MG tablet Take 50 mg by mouth every 6 (six) hours as needed. 08/01/19  Yes [provider]  EPINEPHrine 0.3 mg/0.3 mL IJ SOAJ injection Use as needed for anaphylaxis, allergic reactions 03/21/18   [provider]    Family History Family History  Problem Relation Age of Onset   Diabetes Father    Cancer Maternal Grandfather        bone, skin    Social History Social History   Tobacco Use   Smoking status: Never Smoker   Smokeless tobacco: Never Used  Substance Use Topics   Alcohol use: No    Drug use: No     Allergies   Sudafed pe [phenylephrine]   Review of Systems Review of Systems  Reason unable to perform ROS: See HPI as above.     Physical Exam Triage Vital Signs ED Triage Vitals [08/30/19 1802]  Enc Vitals Group     BP (!) 134/103     Pulse Rate (!) 117     Resp 18     Temp (!) 101.6 F (38.7 C)     Temp Source Temporal     SpO2 96 %     Weight      Height      Head Circumference      Peak Flow      Pain Score 3     Pain Loc      Pain Edu?      Excl. in [place]?    No data found.  Updated Vital Signs BP 117/68 (BP Location: Left Arm)    Pulse (!) 113    Temp (!) 101.6 F (38.7 C) (Oral)    Resp 18    SpO2 95%   Physical Exam Constitutional:      General: He is not in acute distress.    Appearance: Normal appearance. He is not ill-appearing, toxic-appearing or diaphoretic.  HENT:     Head: Normocephalic and atraumatic.     Mouth/Throat:  Mouth: Mucous membranes are moist.     Pharynx: Oropharynx is clear. Uvula midline.  Eyes:     Extraocular Movements: Extraocular movements intact.     Conjunctiva/sclera: Conjunctivae normal.     Pupils: Pupils are equal, round, and reactive to light.  Cardiovascular:     Rate and Rhythm: Regular rhythm. Tachycardia present.     Heart sounds: Normal heart sounds. No murmur. No friction rub. No gallop.   Pulmonary:     Effort: Pulmonary effort is normal. No accessory muscle usage, prolonged expiration, respiratory distress or retractions.     Comments: Lungs clear to auscultation without adventitious lung sounds. Musculoskeletal:     Cervical back: Normal range of motion and neck supple.  Neurological:     General: No focal deficit present.     Mental Status: He is alert and oriented to person, place, and time.    UC Treatments / Results  Labs (all labs ordered are listed, but only abnormal results are displayed) Labs Reviewed  POCT FASTING CBG [facility] MANUAL ENTRY - Abnormal; Notable for the following  components:      Result Value   POCT Glucose ([facility]) 122 (*)    All other components within normal limits  POCT INFLUENZA A/B - Normal  POC SARS CORONAVIRUS 2 AG -  ED - Normal  NOVEL CORONAVIRUS, NAA    EKG   Radiology No results found.  Procedures Procedures (including critical care time)  Medications Ordered in UC Medications  acetaminophen (TYLENOL) tablet 975 mg (975 mg Oral Given 08/30/19 1827)    Initial Impression / Assessment and Plan / UC Course  I have reviewed the triage vital signs and the nursing notes.  Pertinent labs & imaging results that were available during my care of the patient were reviewed by me and considered in my medical decision making (see chart for details).    Patient with temp of 101.6.  Tachycardia likely due to febrile illness at this time.  Patient without tachypnea, hypoxia.  Lungs clear to auscultation bilaterally without adventitious lung sounds.  Will swab for rapid Covid, rapid flu for further evaluation.  Rapid Covid, rapid flu negative.  PCR testing sent.  Patient to quarantine until testing results return.  Currently low suspicion for pneumonia, will have patient continue to monitor cough.  Return precautions given.  Patient expresses understanding and agrees to plan.  Patient states family history of diabetes, has been checking with PCP regularly to screen for diabetes.  However, has not been able to get appointment with PCP due to pandemic.  He denies any polydipsia, polyuria, weakness, dizziness.  However, would like CBG checked.  CBG 122.  Discussed this does not rule out diabetes, but less suspicious given patient is not fasting.  Will have patient follow-up with PCP for further evaluation needed.  Final Clinical Impressions(s) / UC Diagnoses   Final diagnoses:  Fever, unspecified    ED Prescriptions    None     PDMP not reviewed this encounter.   [name], PA-C 08/30/19 1922

## 2019-09-01 LAB — NOVEL CORONAVIRUS, NAA: SARS-CoV-2, NAA: NOT DETECTED

## 2019-09-03 ENCOUNTER — Ambulatory Visit (INDEPENDENT_AMBULATORY_CARE_PROVIDER_SITE_OTHER): Payer: BC Managed Care – PPO | Admitting: Family

## 2019-09-03 ENCOUNTER — Ambulatory Visit
Admission: EM | Admit: 2019-09-03 | Discharge: 2019-09-03 | Disposition: A | Discharge status: Home / Self Care | Payer: BC Managed Care – PPO | Attending: Physician Assistant | Admitting: Physician Assistant

## 2019-09-03 ENCOUNTER — Encounter: Payer: Self-pay | Admitting: Family

## 2019-09-03 ENCOUNTER — Other Ambulatory Visit: Payer: Self-pay

## 2019-09-03 DIAGNOSIS — R509 Fever, unspecified: Secondary | ICD-10-CM

## 2019-09-03 DIAGNOSIS — Z20828 Contact with and (suspected) exposure to other viral communicable diseases: Secondary | ICD-10-CM | POA: Diagnosis not present

## 2019-09-03 DIAGNOSIS — J069 Acute upper respiratory infection, unspecified: Secondary | ICD-10-CM

## 2019-09-03 DIAGNOSIS — J40 Bronchitis, not specified as acute or chronic: Secondary | ICD-10-CM | POA: Diagnosis not present

## 2019-09-03 MED ORDER — ALBUTEROL SULFATE HFA 108 (90 BASE) MCG/ACT IN AERS: 1.0000 | INHALATION_SPRAY | Freq: Four times a day (QID) | RESPIRATORY_TRACT | 0 refills | Status: DC | PRN

## 2019-09-03 MED ORDER — AZITHROMYCIN 250 MG PO TABS: ORAL_TABLET | ORAL | 0 refills | Status: DC

## 2019-09-03 MED ORDER — BENZONATATE 100 MG PO CAPS: 100.0000 mg | ORAL_CAPSULE | Freq: Three times a day (TID) | ORAL | 0 refills | Status: DC | PRN

## 2019-09-03 NOTE — ED Provider Notes (Signed)
[name]    CSN: [id number] Arrival date & time: 09/03/19  1424      History   Chief Complaint Chief Complaint  Patient presents with  . Fever    HPI James Burton is a 48 y.o. male.   48 year old male returns for repeat Covid testing after visit 08/30/2019.  At the time, he had 2-day history of fever, with T-max 101.6.  Covid testing, flu testing negative at the time.  He developed cough 3 days ago.  Had virtual visit with PCP today, who started patient on azithromycin, Tessalon Perles.  PCP also suggested retesting for Covid.  Denies any loss of taste or smell.  Has had some shortness of breath, this usually triggered by talking.   HPI per PCP 09/03/2019: Patient is a 48 yr old male who presents today with chief complaint of cough.  Reports that he woke up Wednesday- with chills through the night. Went ot work on Thursday- did not feel well.  Temp 99.6. Tmax 101.6- went to urgent care. Reports that flu shot, rapid covid and send out covid all negative.   He has been alternating ibupofen with tylenol. He reports some nasal congestion. Cough is generally dry.  Denies sore throat. Only has HA when he coughs.  He denies loss of taste/smell. Appetite is somewhat diminished but he is tolerating PO's.    101.7 temp at 7 oclock  This AM.  Oxygen 98% HR 83 BP 127/73   HPI from visit 08/30/2019: 48 year old male comes in for 2 day history of URI symptoms. Nonproductive cough, rhinorrhea. Fever, chills, body aches. Tmax 101.6. Denies abdominal pain, nausea, vomiting, diarrhea. Denies loss of taste/smell. Feels short of breath, where he feels like he needs to take deep breaths. Never smoker. 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      Past Medical History:  Diagnosis Date  . Bilateral inguinal hernia   . OSA (obstructive sleep apnea) 09/25/2014   Moderate OSA per home study 3/16   . Plantar fasciitis     Patient Active  Problem List   Diagnosis Date Noted  . Plantar fasciitis 01/29/2016  . Cervical pain (neck) 03/07/2015  . OSA (obstructive sleep apnea) 09/25/2014  . Routine general medical examination at a health care facility 03/19/2014    Past Surgical History:  Procedure Laterality Date  . VASECTOMY  2004       Home Medications    Prior to Admission medications   Medication Sig Start Date End Date Taking? Authorizing Provider  albuterol (VENTOLIN HFA) 108 (90 Base) MCG/ACT inhaler Inhale 1-2 puffs into the lungs every 6 (six) hours as needed for wheezing or shortness of breath. 09/03/19   [name], PA-C  azithromycin (ZITHROMAX) 250 MG tablet Take 2 tabs by mouth today, then one tab once daily for 4 more days 09/03/19   [name], NP  benzonatate (TESSALON) 100 MG capsule Take 1 capsule (100 mg total) by mouth 3 (three) times daily as needed. 09/03/19   [name], NP  cyclobenzaprine (FLEXERIL) 10 MG tablet Take 10 mg by mouth every 6 (six) hours as needed. 08/01/19   [provider]  EPINEPHrine 0.3 mg/0.3 mL IJ SOAJ injection Use as needed for anaphylaxis, allergic reactions 03/21/18   [provider]  traMADol (ULTRAM) 50 MG tablet Take 50 mg by mouth every 6 (six) hours as needed. 08/01/19   [provider]    Family History Family History  Problem Relation Age of Onset  . Diabetes Father   . Cancer Maternal Grandfather        bone, skin    Social History Social History   Tobacco Use  . Smoking status: Never Smoker  . Smokeless tobacco: Never Used  Substance Use Topics  . Alcohol use: No  . Drug use: No     Allergies   Sudafed pe [phenylephrine]   Review of Systems Review of Systems  Reason unable to perform ROS: See HPI as above.     Physical Exam Triage Vital Signs ED Triage Vitals  Enc Vitals Group     BP 09/03/19 1504 127/86     Pulse Rate 09/03/19 1504 (!) 104     Resp 09/03/19 1504 18     Temp 09/03/19 1504  98.5 F (36.9 C)     Temp Source 09/03/19 1504 Oral     SpO2 09/03/19 1504 96 %     Weight --      Height --      Head Circumference --      Peak Flow --      Pain Score 09/03/19 1505 0     Pain Loc --      Pain Edu? --      Excl. in [place]? --    No data found.  Updated Vital Signs BP 127/86 (BP Location: Left Arm)   Pulse (!) 104   Temp 98.5 F (36.9 C) (Oral)   Resp 18   SpO2 96%   Physical Exam Constitutional:      General: He is not in acute distress.    Appearance: Normal appearance. He is not ill-appearing, toxic-appearing or diaphoretic.  HENT:     Head: Normocephalic and atraumatic.     Mouth/Throat:     Mouth: Mucous membranes are moist.     Pharynx: Oropharynx is clear. Uvula midline.  Cardiovascular:     Rate and Rhythm: Normal rate and regular rhythm.     Heart sounds: Normal heart sounds. No murmur. No friction rub. No gallop.   Pulmonary:     Effort: Pulmonary effort is normal. No accessory muscle usage, prolonged expiration, respiratory distress or retractions.     Comments: Lungs clear to auscultation without adventitious lung sounds. Musculoskeletal:     Cervical back: Normal range of motion and neck supple.  Neurological:     General: No focal deficit present.     Mental Status: He is alert and oriented to person, place, and time.      UC Treatments / Results  Labs (all labs ordered are listed, but only abnormal results are displayed) Labs Reviewed  NOVEL CORONAVIRUS, NAA    EKG   Radiology No results found.  Procedures Procedures (including critical care time)  Medications Ordered in UC Medications - No data to display  Initial Impression / Assessment and Plan / UC Course  I have reviewed the triage vital signs and the nursing notes.  Pertinent labs & imaging results that were available during my care of the patient were reviewed by me and considered in my medical decision making (see chart for details).    Will repeat Covid  testing as per PCP request.  Patient to start medications as prescribed by PCP.  Will add on albuterol inhaler as needed to help with symptoms.  Return precautions given.  Patient expresses understanding and agrees to plan.  Final Clinical Impressions(s) / UC Diagnoses   Final diagnoses:  Fever, unspecified  Acute  URI   ED Prescriptions    Medication Sig Dispense Auth. Provider   albuterol (VENTOLIN HFA) 108 (90 Base) MCG/ACT inhaler Inhale 1-2 puffs into the lungs every 6 (six) hours as needed for wheezing or shortness of breath. 6.7 g [name], PA-C     I have reviewed the PDMP during this encounter.   [name], PA-C 09/03/19 1557

## 2019-09-03 NOTE — Discharge Instructions (Addendum)
Repeat COVID PCR testing ordered. I would like you to quarantine until testing results. Continue medicines prescribed by PCP. Albuterol as needed. If experiencing shortness of breath, trouble breathing, go to the emergency department for further evaluation needed.

## 2019-09-03 NOTE — Progress Notes (Signed)
Virtual Visit via Video Note  I connected with Stephan Minister on 09/03/19 at  9:20 AM EST by a video enabled telemedicine application and verified that I am speaking with the correct person using two identifiers.  Location: Patient: home Provider: home   I discussed the limitations of evaluation and management by telemedicine and the availability of in person appointments. The patient expressed understanding and agreed to proceed.  History of Present Illness:  Patient is a 48 yr old male who presents today with chief complaint of cough.  Reports that he woke up Wednesday- with chills through the night. Went ot work on Thursday- did not feel well.  Temp 99.6. Tmax 101.6- went to urgent care. Reports that flu shot, rapid covid and send out covid all negative.   He has been alternating ibupofen with tylenol. He reports some nasal congestion. Cough is generally dry.  Denies sore throat. Only has HA when he coughs.  He denies loss of taste/smell. Appetite is somewhat diminished but he is tolerating PO's.    101.7 temp at 7 oclock  This AM.  Oxygen 98% HR 83 BP 127/73    Past Medical History:  Diagnosis Date  . Bilateral inguinal hernia   . OSA (obstructive sleep apnea) 09/25/2014   Moderate OSA per home study 3/16   . Plantar fasciitis      Social History   Socioeconomic History  . Marital status: Married    Spouse name: Not on file  . Number of children: Not on file  . Years of education: Not on file  . Highest education level: Not on file  Occupational History  . Occupation: Patent examiner  Tobacco Use  . Smoking status: Never Smoker  . Smokeless tobacco: Never Used  Substance and Sexual Activity  . Alcohol use: No  . Drug use: No  . Sexual activity: Not on file  Other Topics Concern  . Not on file  Social History Narrative   2 boys- ages 64 and 62   Married   Enjoys Designer, fashion/clothing   Completed 4 yrs of Media planner schooling   Works as Facilities manager   Social  Determinants of Radio broadcast assistant Strain:   . Difficulty of Paying Living Expenses: Not on file  Food Insecurity:   . Worried About Charity fundraiser in the Last Year: Not on file  . Ran Out of Food in the Last Year: Not on file  Transportation Needs:   . Lack of Transportation (Medical): Not on file  . Lack of Transportation (Non-Medical): Not on file  Physical Activity:   . Days of Exercise per Week: Not on file  . Minutes of Exercise per Session: Not on file  Stress:   . Feeling of Stress : Not on file  Social Connections:   . Frequency of Communication with Friends and Family: Not on file  . Frequency of Social Gatherings with Friends and Family: Not on file  . Attends Religious Services: Not on file  . Active Member of Clubs or Organizations: Not on file  . Attends Archivist Meetings: Not on file  . Marital Status: Not on file  Intimate Partner Violence:   . Fear of Current or Ex-Partner: Not on file  . Emotionally Abused: Not on file  . Physically Abused: Not on file  . Sexually Abused: Not on file    Past Surgical History:  Procedure Laterality Date  . VASECTOMY  2004    Family History  Problem Relation Age of Onset  . Diabetes Father   . Cancer Maternal Grandfather        bone, skin    Allergies  Allergen Reactions  . Sudafed Pe [Phenylephrine] Other (See Comments)    Rash on palms    Current Outpatient Medications on File Prior to Visit  Medication Sig Dispense Refill  . cyclobenzaprine (FLEXERIL) 10 MG tablet Take 10 mg by mouth every 6 (six) hours as needed.    Marland Kitchen EPINEPHrine 0.3 mg/0.3 mL IJ SOAJ injection Use as needed for anaphylaxis, allergic reactions    . traMADol (ULTRAM) 50 MG tablet Take 50 mg by mouth every 6 (six) hours as needed.     No current facility-administered medications on file prior to visit.    There were no vitals taken for this visit.        Observations/Objective:   Gen: Awake, alert, no acute  distress- appears tired Resp: Breathing is even and non-labored Psych: calm/pleasant demeanor Neuro: Alert and Oriented x 3, + facial symmetry, speech is clear.   Assessment and Plan:  Bronchitis- due to ongoing fever, will rx with azithromax to cover for pneumonia. Add tessalon as needed for cough. Continue alternating tylenol and ibuprofen as needed for fever.  Wife who is RN reports she listened to his lungs last night and he had some faint wheezing.  Lungs are "pretty clear" today.  I have recommended repeat covid-19 testing due to his ongoing symptoms.  I gave pt and wife the information for our [place] sign up. Pt is advised to go to the ER if severe/worsening symptoms, oxygen 92%, shortness of breath and to let me know if he is not improved in 2-3 days.  Pt and wife verbalizes understanding.  He plans to remain out of work until after Christmas which I agree with.  Follow Up Instructions:    I discussed the assessment and treatment plan with the patient. The patient was provided an opportunity to ask questions and all were answered. The patient agreed with the plan and demonstrated an understanding of the instructions.   The patient was advised to call back or seek an in-person evaluation if the symptoms worsen or if the condition fails to improve as anticipated.  Nance Pear, NP

## 2019-09-03 NOTE — ED Triage Notes (Signed)
Pt c/o fever x5 days, developed a cough on Saturday. States neg COVID test last Thursday. Was given Ladona Ridgel today by PCP, here now d/t wanting something stronger.

## 2019-09-04 LAB — NOVEL CORONAVIRUS, NAA: SARS-CoV-2, NAA: NOT DETECTED

## 2019-09-28 DIAGNOSIS — G4733 Obstructive sleep apnea (adult) (pediatric): Secondary | ICD-10-CM | POA: Diagnosis not present

## 2019-10-22 DIAGNOSIS — G4733 Obstructive sleep apnea (adult) (pediatric): Secondary | ICD-10-CM | POA: Diagnosis not present

## 2019-11-08 ENCOUNTER — Encounter: Payer: Self-pay | Admitting: Family

## 2019-11-16 ENCOUNTER — Ambulatory Visit (INDEPENDENT_AMBULATORY_CARE_PROVIDER_SITE_OTHER): Payer: BC Managed Care – PPO | Admitting: Family

## 2019-11-16 ENCOUNTER — Encounter: Payer: Self-pay | Admitting: Family

## 2019-11-16 ENCOUNTER — Other Ambulatory Visit: Payer: Self-pay

## 2019-11-16 ENCOUNTER — Telehealth: Payer: Self-pay

## 2019-11-16 VITALS — BP 117/82 | HR 76 | Temp 96.5°F | Resp 16 | Ht 74.0 in | Wt 245.0 lb

## 2019-11-16 DIAGNOSIS — Z0000 Encounter for general adult medical examination without abnormal findings: Secondary | ICD-10-CM | POA: Diagnosis not present

## 2019-11-16 DIAGNOSIS — H9190 Unspecified hearing loss, unspecified ear: Secondary | ICD-10-CM

## 2019-11-16 DIAGNOSIS — R6882 Decreased libido: Secondary | ICD-10-CM | POA: Diagnosis not present

## 2019-11-16 LAB — HEPATIC FUNCTION PANEL
ALT: 37 U/L (ref 0–53)
AST: 23 U/L (ref 0–37)
Albumin: 4.5 g/dL (ref 3.5–5.2)
Alkaline Phosphatase: 92 U/L (ref 39–117)
Bilirubin, Direct: 0.1 mg/dL (ref 0.0–0.3)
Total Bilirubin: 0.7 mg/dL (ref 0.2–1.2)
Total Protein: 7 g/dL (ref 6.0–8.3)

## 2019-11-16 LAB — BASIC METABOLIC PANEL
BUN: 20 mg/dL (ref 6–23)
CO2: 28 mEq/L (ref 19–32)
Calcium: 9.4 mg/dL (ref 8.4–10.5)
Chloride: 104 mEq/L (ref 96–112)
Creatinine, Ser: 1.23 mg/dL (ref 0.40–1.50)
GFR: 62.58 mL/min (ref >60.00)
Glucose, Bld: 98 mg/dL (ref 70–99)
Potassium: 4.8 mEq/L (ref 3.5–5.1)
Sodium: 140 mEq/L (ref 135–145)

## 2019-11-16 LAB — LIPID PANEL
Cholesterol: 176 mg/dL (ref 0–200)
HDL: 30.3 mg/dL — ABNORMAL LOW (ref >39.00)
LDL Cholesterol: 122 mg/dL — ABNORMAL HIGH (ref 0–99)
NonHDL: 145.85
Total CHOL/HDL Ratio: 6
Triglycerides: 118 mg/dL (ref 0.0–149.0)
VLDL: 23.6 mg/dL (ref 0.0–40.0)

## 2019-11-16 LAB — CBC WITH DIFFERENTIAL/PLATELET
Basophils Absolute: 0 10*3/uL (ref 0.0–0.1)
Basophils Relative: 0.9 % (ref 0.0–3.0)
Eosinophils Absolute: 0.2 10*3/uL (ref 0.0–0.7)
Eosinophils Relative: 3 % (ref 0.0–5.0)
HCT: 44.8 % (ref 39.0–52.0)
Hemoglobin: 15.5 g/dL (ref 13.0–17.0)
Lymphocytes Relative: 24.9 % (ref 12.0–46.0)
Lymphs Abs: 1.3 10*3/uL (ref 0.7–4.0)
MCHC: 34.5 g/dL (ref 30.0–36.0)
MCV: 87.2 fl (ref 78.0–100.0)
Monocytes Absolute: 0.5 10*3/uL (ref 0.1–1.0)
Monocytes Relative: 9.3 % (ref 3.0–12.0)
Neutro Abs: 3.3 10*3/uL (ref 1.4–7.7)
Neutrophils Relative %: 61.9 % (ref 43.0–77.0)
Platelets: 177 10*3/uL (ref 150.0–400.0)
RBC: 5.13 Mil/uL (ref 4.22–5.81)
RDW: 13 % (ref 11.5–15.5)
WBC: 5.4 10*3/uL (ref 4.0–10.5)

## 2019-11-16 LAB — TSH: TSH: 1.84 u[IU]/mL (ref 0.35–4.50)

## 2019-11-16 MED ORDER — TADALAFIL 5 MG PO TABS: ORAL_TABLET | ORAL | 1 refills | Status: DC

## 2019-11-16 MED ORDER — OMEPRAZOLE 20 MG PO CPDR: 20.0000 mg | DELAYED_RELEASE_CAPSULE | Freq: Every day | ORAL | 1 refills | Status: DC

## 2019-11-16 NOTE — Telephone Encounter (Signed)
PA initiated via Covermymeds; KEY: [id number]. PA approved.   [id number];Review Type:Prior Auth;Coverage Start Date:10/17/2019;Coverage End Date:11/15/2020

## 2019-11-16 NOTE — Progress Notes (Signed)
Subjective:    Patient ID: James Burton, male    DOB: 1971/01/16, 49 y.o.   MRN: [id number]  HPI  Patient presents today for complete physical.  Immunizations:  Td 2019 Diet:  Needs improvement Wt Readings from Last 3 Encounters:  11/16/19 245 lb (111.1 kg)  08/23/18 231 lb 3.2 oz (104.9 kg)  05/08/18 230 lb 3.2 oz (104.4 kg)  Exercise:  No formal Vision:  Last week (wears readers) Dental: up to date  Reports that he has been having gerd symptoms intermittently. Has been using otc prilosec with good improvement of his symptoms.  He also complains of decreased libido and some issues with ED-notes that he does not have difficulty achieving an erection but has some difficulty maintaining erection about 50% of the time.    Review of Systems  Constitutional: Positive for unexpected weight change.  HENT: Positive for hearing loss (wife thinks he needs a hearing test) and rhinorrhea (attributes to allergies in the AM).   Eyes: Negative for visual disturbance.  Respiratory: Negative for cough and shortness of breath.   Cardiovascular: Negative for chest pain.  Gastrointestinal: Negative for constipation and diarrhea.  Genitourinary: Negative for difficulty urinating, dysuria and frequency.  Musculoskeletal: Negative for arthralgias and myalgias.  Skin: Negative for rash.  Neurological: Negative for headaches.  Hematological: Negative for adenopathy.  Psychiatric/Behavioral:       Denies depression/anxiety       Past Medical History:  Diagnosis Date  . Bilateral inguinal hernia   . OSA (obstructive sleep apnea) 09/25/2014   Moderate OSA per home study 3/16   . Plantar fasciitis      Social History   Socioeconomic History  . Marital status: Married    Spouse name: Not on file  . Number of children: Not on file  . Years of education: Not on file  . Highest education level: Not on file  Occupational History  . Occupation: Patent examiner  Tobacco Use  . Smoking  status: Never Smoker  . Smokeless tobacco: Never Used  Substance and Sexual Activity  . Alcohol use: No  . Drug use: No  . Sexual activity: Not on file  Other Topics Concern  . Not on file  Social History Narrative   2 boys- ages 11 and 40   Married   Enjoys Designer, fashion/clothing   Completed 4 yrs of Media planner schooling   Works as Facilities manager   Social Determinants of Radio broadcast assistant Strain:   . Difficulty of Paying Living Expenses: Not on file  Food Insecurity:   . Worried About Charity fundraiser in the Last Year: Not on file  . Ran Out of Food in the Last Year: Not on file  Transportation Needs:   . Lack of Transportation (Medical): Not on file  . Lack of Transportation (Non-Medical): Not on file  Physical Activity:   . Days of Exercise per Week: Not on file  . Minutes of Exercise per Session: Not on file  Stress:   . Feeling of Stress : Not on file  Social Connections:   . Frequency of Communication with Friends and Family: Not on file  . Frequency of Social Gatherings with Friends and Family: Not on file  . Attends Religious Services: Not on file  . Active Member of Clubs or Organizations: Not on file  . Attends Archivist Meetings: Not on file  . Marital Status: Not on file  Intimate Partner Violence:   .  Fear of Current or Ex-Partner: Not on file  . Emotionally Abused: Not on file  . Physically Abused: Not on file  . Sexually Abused: Not on file    Past Surgical History:  Procedure Laterality Date  . VASECTOMY  2004    Family History  Problem Relation Age of Onset  . Diabetes Father   . Cancer Maternal Grandfather        bone, skin    Allergies  Allergen Reactions  . Sudafed Pe [Phenylephrine] Other (See Comments)    Rash on palms    Current Outpatient Medications on File Prior to Visit  Medication Sig Dispense Refill  . cyclobenzaprine (FLEXERIL) 10 MG tablet Take 10 mg by mouth every 6 (six) hours as needed.    Marland Kitchen EPINEPHrine  0.3 mg/0.3 mL IJ SOAJ injection Use as needed for anaphylaxis, allergic reactions    . traMADol (ULTRAM) 50 MG tablet Take 50 mg by mouth every 6 (six) hours as needed.     No current facility-administered medications on file prior to visit.    BP 117/82 (BP Location: Left Arm, Patient Position: Sitting, Cuff Size: Large)   Pulse 76   Temp (!) 96.5 F (35.8 C) (Temporal)   Resp 16   Ht 6\' 2"  (1.88 m)   Wt 245 lb (111.1 kg)   SpO2 98%   BMI 31.46 kg/m    Objective:   Physical Exam  Physical Exam  Constitutional: He is oriented to person, place, and time. He appears well-developed and well-nourished. No distress.  HENT:  Head: Normocephalic and atraumatic.  Right Ear: Tympanic membrane and ear canal normal.  Left Ear: Tympanic membrane and ear canal normal.  Mouth/Throat: not examined- pt wearing a mask Eyes: Pupils are equal, round, and reactive to light. No scleral icterus.  Neck: Normal range of motion. No thyromegaly present.  Cardiovascular: Normal rate and regular rhythm.   No murmur heard. Pulmonary/Chest: Effort normal and breath sounds normal. No respiratory distress. He has no wheezes. He has no rales. He exhibits no tenderness.  Abdominal: Soft. Bowel sounds are normal. He exhibits no distension and no mass. There is no tenderness. There is no rebound and no guarding.  Musculoskeletal: He exhibits no edema.  Lymphadenopathy:    He has no cervical adenopathy.  Neurological: He is alert and oriented to person, place, and time. He has normal patellar reflexes. He exhibits normal muscle tone. Coordination normal.  Skin: Skin is warm and dry.  Psychiatric: He has a normal mood and affect. His behavior is normal. Judgment and thought content normal.           Assessment & Plan:   Preventative care- immunizations reviewed and up to date.  Will obtain routine lab work.  Discussed healthy diet, exercise, weight loss.   ED- trial of cialis.  Low libido- will obtain  testosterone level.   Hearing problem- refer to audiology for testing  This visit occurred during the SARS-CoV-2 public health emergency.  Safety protocols were in place, including screening questions prior to the visit, additional usage of staff PPE, and extensive cleaning of exam room while observing appropriate contact time as indicated for disinfecting solutions.        Assessment & Plan:

## 2019-11-16 NOTE — Patient Instructions (Signed)
Please complete lab work prior to leaving. Work on [employer], exercise, weight loss.   Preventive Care 2-49 Years Old, Male Preventive care refers to lifestyle choices and visits with your health care provider that can promote health and wellness. This includes:  A yearly physical exam. This is also called an annual well check.  Regular dental and eye exams.  Immunizations.  Screening for certain conditions.  Healthy lifestyle choices, such as eating a healthy diet, getting regular exercise, not using drugs or products that contain nicotine and tobacco, and limiting alcohol use. What can I expect for my preventive care visit? Physical exam Your health care provider will check:  Height and weight. These may be used to calculate body mass index (BMI), which is a measurement that tells if you are at a healthy weight.  Heart rate and blood pressure.  Your skin for abnormal spots. Counseling Your health care provider may ask you questions about:  Alcohol, tobacco, and drug use.  Emotional well-being.  Home and relationship well-being.  Sexual activity.  Eating habits.  Work and work Statistician. What immunizations do I need?  Influenza (flu) vaccine  This is recommended every year. Tetanus, diphtheria, and pertussis (Tdap) vaccine  You may need a Td booster every 10 years. Varicella (chickenpox) vaccine  You may need this vaccine if you have not already been vaccinated. Zoster (shingles) vaccine  You may need this after age 34. Measles, mumps, and rubella (MMR) vaccine  You may need at least one dose of MMR if you were born in 1957 or later. You may also need a second dose. Pneumococcal conjugate (PCV13) vaccine  You may need this if you have certain conditions and were not previously vaccinated. Pneumococcal polysaccharide (PPSV23) vaccine  You may need one or two doses if you smoke cigarettes or if you have certain conditions. Meningococcal conjugate  (MenACWY) vaccine  You may need this if you have certain conditions. Hepatitis A vaccine  You may need this if you have certain conditions or if you travel or work in places where you may be exposed to hepatitis A. Hepatitis B vaccine  You may need this if you have certain conditions or if you travel or work in places where you may be exposed to hepatitis B. Haemophilus influenzae type b (Hib) vaccine  You may need this if you have certain risk factors. Human papillomavirus (HPV) vaccine  If recommended by your health care provider, you may need three doses over 6 months. You may receive vaccines as individual doses or as more than one vaccine together in one shot (combination vaccines). Talk with your health care provider about the risks and benefits of combination vaccines. What tests do I need? Blood tests  Lipid and cholesterol levels. These may be checked every 5 years, or more frequently if you are over 42 years old.  Hepatitis C test.  Hepatitis B test. Screening  Lung cancer screening. You may have this screening every year starting at age 45 if you have a 30-pack-year history of smoking and currently smoke or have quit within the past 15 years.  Prostate cancer screening. Recommendations will vary depending on your family history and other risks.  Colorectal cancer screening. All adults should have this screening starting at age 59 and continuing until age 2. Your health care provider may recommend screening at age 36 if you are at increased risk. You will have tests every 1-10 years, depending on your results and the type of screening test.  Diabetes screening. This is done by checking your blood sugar (glucose) after you have not eaten for a while (fasting). You may have this done every 1-3 years.  Sexually transmitted disease (STD) testing. Follow these instructions at home: Eating and drinking  Eat a diet that includes fresh fruits and vegetables, whole grains,  lean protein, and low-fat dairy products.  Take vitamin and mineral supplements as recommended by your health care provider.  Do not drink alcohol if your health care provider tells you not to drink.  If you drink alcohol: ? Limit how much you have to 0-2 drinks a day. ? Be aware of how much alcohol is in your drink. In the [place] one drink equals one 12 oz bottle of beer (355 mL), one 5 oz glass of wine (148 mL), or one 1 oz glass of hard liquor (44 mL). Lifestyle  Take daily care of your teeth and gums.  Stay active. Exercise for at least 30 minutes on 5 or more days each week.  Do not use any products that contain nicotine or tobacco, such as cigarettes, e-cigarettes, and chewing tobacco. If you need help quitting, ask your health care provider.  If you are sexually active, practice safe sex. Use a condom or other form of protection to prevent STIs (sexually transmitted infections).  Talk with your health care provider about taking a low-dose aspirin every day starting at age 103. What's next?  Go to your health care provider once a year for a well check visit.  Ask your health care provider how often you should have your eyes and teeth checked.  Stay up to date on all vaccines. This information is not intended to replace advice given to you by your health care provider. Make sure you discuss any questions you have with your health care provider. Document Revised: 08/24/2018 Document Reviewed: 08/24/2018 Elsevier Patient Education  2020 [employer].

## 2019-11-19 LAB — TESTOSTERONE TOTAL,FREE,BIO, MALES
Albumin: 4.5 g/dL (ref 3.6–5.1)
Sex Hormone Binding: 23 nmol/L (ref 10–50)
Testosterone, Bioavailable: 119.5 ng/dL (ref >=110.0)
Testosterone, Free: 58.1 pg/mL (ref 46.0–224.0)
Testosterone: 342 ng/dL (ref 250–827)

## 2019-12-05 ENCOUNTER — Encounter: Payer: Self-pay | Admitting: Family

## 2019-12-05 DIAGNOSIS — Z20822 Contact with and (suspected) exposure to covid-19: Secondary | ICD-10-CM

## 2019-12-11 ENCOUNTER — Other Ambulatory Visit: Payer: Self-pay

## 2019-12-11 ENCOUNTER — Other Ambulatory Visit (INDEPENDENT_AMBULATORY_CARE_PROVIDER_SITE_OTHER): Payer: BC Managed Care – PPO

## 2019-12-11 DIAGNOSIS — Z20822 Contact with and (suspected) exposure to covid-19: Secondary | ICD-10-CM | POA: Diagnosis not present

## 2019-12-12 LAB — SAR COV2 SEROLOGY (COVID19)AB(IGG),IA: SARS CoV2 AB IGG: NEGATIVE

## 2019-12-20 DIAGNOSIS — H9313 Tinnitus, bilateral: Secondary | ICD-10-CM | POA: Diagnosis not present

## 2019-12-20 DIAGNOSIS — H9042 Sensorineural hearing loss, unilateral, left ear, with unrestricted hearing on the contralateral side: Secondary | ICD-10-CM | POA: Diagnosis not present

## 2019-12-20 DIAGNOSIS — H93293 Other abnormal auditory perceptions, bilateral: Secondary | ICD-10-CM | POA: Diagnosis not present

## 2020-01-01 ENCOUNTER — Encounter: Payer: Self-pay | Admitting: Family

## 2020-01-02 MED ORDER — OMEPRAZOLE 20 MG PO CPDR: 20.0000 mg | DELAYED_RELEASE_CAPSULE | Freq: Every day | ORAL | 1 refills | Status: DC

## 2020-01-21 DIAGNOSIS — G4733 Obstructive sleep apnea (adult) (pediatric): Secondary | ICD-10-CM | POA: Diagnosis not present

## 2020-04-08 ENCOUNTER — Other Ambulatory Visit: Payer: Self-pay | Admitting: Neurological Surgery

## 2020-04-08 DIAGNOSIS — M5412 Radiculopathy, cervical region: Secondary | ICD-10-CM

## 2020-04-15 ENCOUNTER — Other Ambulatory Visit: Payer: Self-pay

## 2020-04-15 ENCOUNTER — Ambulatory Visit
Admission: RE | Admit: 2020-04-15 | Discharge: 2020-04-15 | Disposition: A | Discharge status: Home / Self Care | Payer: BC Managed Care – PPO | Source: Ambulatory Visit | Attending: Neurological Surgery | Admitting: Neurological Surgery

## 2020-04-15 DIAGNOSIS — M5412 Radiculopathy, cervical region: Secondary | ICD-10-CM

## 2020-04-15 DIAGNOSIS — M79602 Pain in left arm: Secondary | ICD-10-CM | POA: Diagnosis not present

## 2020-04-15 MED ORDER — DIAZEPAM 5 MG PO TABS
10.0000 mg | ORAL_TABLET | Freq: Once | ORAL | Status: AC
  Administered 2020-04-15: 5 mg via ORAL

## 2020-04-15 MED ORDER — IOPAMIDOL (ISOVUE-M 300) INJECTION 61%
1.0000 mL | Freq: Once | INTRAMUSCULAR | Status: AC | PRN
  Administered 2020-04-15: 1 mL via EPIDURAL

## 2020-04-15 MED ORDER — TRIAMCINOLONE ACETONIDE 40 MG/ML IJ SUSP (RADIOLOGY)
60.0000 mg | Freq: Once | INTRAMUSCULAR | Status: AC
  Administered 2020-04-15: 60 mg via EPIDURAL

## 2020-04-21 DIAGNOSIS — G4733 Obstructive sleep apnea (adult) (pediatric): Secondary | ICD-10-CM | POA: Diagnosis not present

## 2020-05-26 ENCOUNTER — Other Ambulatory Visit: Payer: Self-pay

## 2020-05-26 ENCOUNTER — Ambulatory Visit
Admission: EM | Admit: 2020-05-26 | Discharge: 2020-05-26 | Disposition: A | Discharge status: Home / Self Care | Payer: BC Managed Care – PPO | Attending: Physician Assistant | Admitting: Physician Assistant

## 2020-05-26 DIAGNOSIS — Z1152 Encounter for screening for COVID-19: Secondary | ICD-10-CM | POA: Diagnosis not present

## 2020-05-26 NOTE — Discharge Instructions (Signed)

## 2020-05-26 NOTE — ED Triage Notes (Signed)
Pt present exposure to covid from a coworker. Pt has a slight cough. He also took a home test and it had a light line on the test that its possible that he could be positive.

## 2020-05-27 LAB — NOVEL CORONAVIRUS, NAA: SARS-CoV-2, NAA: NOT DETECTED

## 2020-05-27 LAB — SARS-COV-2, NAA 2 DAY TAT

## 2020-07-22 DIAGNOSIS — G4733 Obstructive sleep apnea (adult) (pediatric): Secondary | ICD-10-CM | POA: Diagnosis not present

## 2020-10-20 DIAGNOSIS — G4733 Obstructive sleep apnea (adult) (pediatric): Secondary | ICD-10-CM | POA: Diagnosis not present

## 2020-11-04 ENCOUNTER — Other Ambulatory Visit: Payer: Self-pay

## 2020-11-04 ENCOUNTER — Encounter: Payer: Self-pay | Admitting: Family

## 2020-11-04 ENCOUNTER — Ambulatory Visit (INDEPENDENT_AMBULATORY_CARE_PROVIDER_SITE_OTHER): Payer: BC Managed Care – PPO | Admitting: Family

## 2020-11-04 VITALS — BP 121/68 | HR 68 | Temp 98.6°F | Resp 16 | Ht 73.5 in | Wt 235.0 lb

## 2020-11-04 DIAGNOSIS — Z125 Encounter for screening for malignant neoplasm of prostate: Secondary | ICD-10-CM

## 2020-11-04 DIAGNOSIS — Z0000 Encounter for general adult medical examination without abnormal findings: Secondary | ICD-10-CM

## 2020-11-04 DIAGNOSIS — R030 Elevated blood-pressure reading, without diagnosis of hypertension: Secondary | ICD-10-CM | POA: Diagnosis not present

## 2020-11-04 NOTE — Progress Notes (Signed)
Subjective:    Patient ID: James Burton, male    DOB: [birth year], 50 y.o.   MRN: [id number]  HPI  Patient is a 50 yr old male who presents today to discuss elevated home blood pressure readings. He reports that one evening his blood pressure at home was 732 systolic.   BP Readings from Last 3 Encounters:  11/04/20 121/68  05/26/20 (!) 152/96  04/15/20 (!) 145/93    Wt Readings from Last 3 Encounters:  11/04/20 235 lb (106.6 kg)  11/16/19 245 lb (111.1 kg)  08/23/18 231 lb 3.2 oz (104.9 kg)     Review of Systems  Past Medical History:  Diagnosis Date  . Bilateral inguinal hernia   . OSA (obstructive sleep apnea) 09/25/2014   Moderate OSA per home study 3/16   . Plantar fasciitis      Social History   Socioeconomic History  . Marital status: Married    Spouse name: Not on file  . Number of children: Not on file  . Years of education: Not on file  . Highest education level: Not on file  Occupational History  . Occupation: Patent examiner  Tobacco Use  . Smoking status: Never Smoker  . Smokeless tobacco: Never Used  Substance and Sexual Activity  . Alcohol use: No  . Drug use: No  . Sexual activity: Not on file  Other Topics Concern  . Not on file  Social History Narrative   2 boys- ages 74 and 101   Married   Enjoys Designer, fashion/clothing   Completed 4 yrs of Media planner schooling   Works as Facilities manager   Social Determinants of Radio broadcast assistant Strain: Not on Art therapist Insecurity: Not on file  Transportation Needs: Not on file  Physical Activity: Not on file  Stress: Not on file  Social Connections: Not on file  Intimate Partner Violence: Not on file    Past Surgical History:  Procedure Laterality Date  . VASECTOMY  2004    Family History  Problem Relation Age of Onset  . Diabetes Father   . Cancer Maternal Grandfather        bone, skin    Allergies  Allergen Reactions  . Sudafed Pe [Phenylephrine] Other (See Comments)    Rash on  palms    Current Outpatient Medications on File Prior to Visit  Medication Sig Dispense Refill  . cyclobenzaprine (FLEXERIL) 10 MG tablet Take 10 mg by mouth 3 (three) times daily as needed for muscle spasms.    Marland Kitchen EPINEPHrine 0.3 mg/0.3 mL IJ SOAJ injection Use as needed for anaphylaxis, allergic reactions    . omeprazole (PRILOSEC) 20 MG capsule Take 1 capsule (20 mg total) by mouth daily. 90 capsule 1  . tadalafil (CIALIS) 5 MG tablet 1-2 tablets by mouth 30 minutes prior to sexual activity 30 tablet 1  . traMADol (ULTRAM) 50 MG tablet Take 50 mg by mouth every 6 (six) hours as needed.     No current facility-administered medications on file prior to visit.    BP 121/68 (BP Location: Right Arm, Patient Position: Sitting, Cuff Size: Small)   Pulse 68   Temp 98.6 F (37 C) (Oral)   Resp 16   Ht 6' 1.5" (1.867 m)   Wt 235 lb (106.6 kg)   SpO2 99%   BMI 30.58 kg/m       Objective:   Physical Exam Constitutional:      General: He is not in acute  distress.    Appearance: He is well-developed and well-nourished.  HENT:     Head: Normocephalic and atraumatic.  Cardiovascular:     Rate and Rhythm: Normal rate and regular rhythm.     Heart sounds: No murmur heard.   Pulmonary:     Effort: Pulmonary effort is normal. No respiratory distress.     Breath sounds: Normal breath sounds. No wheezing or rales.  Musculoskeletal:        General: No edema.  Skin:    General: Skin is warm and dry.  Neurological:     Mental Status: He is alert and oriented to person, place, and time.  Psychiatric:        Mood and Affect: Mood and affect normal.        Behavior: Behavior normal.        Thought Content: Thought content normal.           Assessment & Plan:  Elevated blood pressure readings-  BP here today looks great.  I have asked the pt to check his blood pressure once daily at home for 1 week and then send me his readings via mychart. Further recommendations pending review of  these readings.  His is requesting PSA check (discussed pros/cons), as well as referral for colonoscopy. Orders placed.    This visit occurred during the SARS-CoV-2 public health emergency.  Safety protocols were in place, including screening questions prior to the visit, additional usage of staff PPE, and extensive cleaning of exam room while observing appropriate contact time as indicated for disinfecting solutions.

## 2020-11-05 LAB — PSA: PSA: 0.72 ng/mL (ref 0.10–4.00)

## 2021-01-19 DIAGNOSIS — G4733 Obstructive sleep apnea (adult) (pediatric): Secondary | ICD-10-CM | POA: Diagnosis not present

## 2021-01-21 ENCOUNTER — Other Ambulatory Visit: Payer: Self-pay | Admitting: Neurological Surgery

## 2021-01-21 DIAGNOSIS — M5412 Radiculopathy, cervical region: Secondary | ICD-10-CM

## 2021-01-23 ENCOUNTER — Ambulatory Visit (AMBULATORY_SURGERY_CENTER): Payer: BC Managed Care – PPO

## 2021-01-23 ENCOUNTER — Other Ambulatory Visit: Payer: Self-pay

## 2021-01-23 ENCOUNTER — Encounter: Payer: Self-pay | Admitting: Gastroenterology

## 2021-01-23 VITALS — Ht 73.5 in | Wt 235.0 lb

## 2021-01-23 DIAGNOSIS — Z1211 Encounter for screening for malignant neoplasm of colon: Secondary | ICD-10-CM

## 2021-01-23 NOTE — Progress Notes (Signed)
Pre visit completed via phone call; patient verified name, DOB, and address;  No egg or soy allergy known to patient  No issues with past sedation with any surgeries or procedures Patient denies ever being told they had issues or difficulty with intubation  No FH of Malignant Hyperthermia No diet pills per patient No home 02 use per patient  No blood thinners per patient  Pt denies issues with constipation  No A fib or A flutter  EMMI video via MyChart  COVID 19 guidelines implemented in PV today with Pt and RN   Pt is fully vaccinated for Covid x 2;  NO PA's for preps discussed with pt in PV today  Discussed with pt there will be an out-of-pocket cost for prep and that varies from $0 to 70 +  dollars   Due to the COVID-19 pandemic we are asking patients to follow certain guidelines.  Pt aware of COVID protocols and [facility] guidelines   

## 2021-02-02 ENCOUNTER — Encounter: Payer: Self-pay | Admitting: Gastroenterology

## 2021-02-06 ENCOUNTER — Other Ambulatory Visit: Payer: Self-pay

## 2021-02-06 ENCOUNTER — Encounter: Payer: Self-pay | Admitting: Gastroenterology

## 2021-02-06 ENCOUNTER — Ambulatory Visit (AMBULATORY_SURGERY_CENTER): Payer: BC Managed Care – PPO | Admitting: Gastroenterology

## 2021-02-06 VITALS — BP 108/79 | HR 62 | Temp 97.9°F | Resp 12 | Ht 73.0 in | Wt 235.0 lb

## 2021-02-06 DIAGNOSIS — D123 Benign neoplasm of transverse colon: Secondary | ICD-10-CM | POA: Diagnosis not present

## 2021-02-06 DIAGNOSIS — D125 Benign neoplasm of sigmoid colon: Secondary | ICD-10-CM | POA: Diagnosis not present

## 2021-02-06 DIAGNOSIS — Z1211 Encounter for screening for malignant neoplasm of colon: Secondary | ICD-10-CM

## 2021-02-06 DIAGNOSIS — K5730 Diverticulosis of large intestine without perforation or abscess without bleeding: Secondary | ICD-10-CM

## 2021-02-06 DIAGNOSIS — K640 First degree hemorrhoids: Secondary | ICD-10-CM

## 2021-02-06 HISTORY — PX: COLONOSCOPY: SHX174

## 2021-02-06 MED ORDER — SODIUM CHLORIDE 0.9 % IV SOLN: 500.0000 mL | Freq: Once | INTRAVENOUS | Status: DC

## 2021-02-06 NOTE — Progress Notes (Signed)
A and O x3. Report to RN. Tolerated MAC anesthesia well.

## 2021-02-06 NOTE — Progress Notes (Signed)
Called to room to assist during endoscopic procedure.  Patient ID and intended procedure confirmed with present staff. Received instructions for my participation in the procedure from the performing physician.  

## 2021-02-06 NOTE — Patient Instructions (Signed)
YOU HAD AN ENDOSCOPIC PROCEDURE TODAY AT THE [facility]:   Refer to the procedure report that was given to you for any specific questions about what was found during the examination.  If the procedure report does not answer your questions, please call your gastroenterologist to clarify.  If you requested that your care partner not be given the details of your procedure findings, then the procedure report has been included in a sealed envelope for you to review at your convenience later.  YOU SHOULD EXPECT: Some feelings of bloating in the abdomen. Passage of more gas than usual.  Walking can help get rid of the air that was put into your GI tract during the procedure and reduce the bloating. If you had a lower endoscopy (such as a colonoscopy or flexible sigmoidoscopy) you may notice spotting of blood in your stool or on the toilet paper. If you underwent a bowel prep for your procedure, you may not have a normal bowel movement for a few days.  Please Note:  You might notice some irritation and congestion in your nose or some drainage.  This is from the oxygen used during your procedure.  There is no need for concern and it should clear up in a day or so.  SYMPTOMS TO REPORT IMMEDIATELY:   Following lower endoscopy (colonoscopy or flexible sigmoidoscopy):  Excessive amounts of blood in the stool  Significant tenderness or worsening of abdominal pains  Swelling of the abdomen that is new, acute  Fever of 100F or higher   Following upper endoscopy (EGD)  Vomiting of blood or coffee ground material  New chest pain or pain under the shoulder blades  Painful or persistently difficult swallowing  New shortness of breath  Fever of 100F or higher  Black, tarry-looking stools  For urgent or emergent issues, a gastroenterologist can be reached at any hour by calling [phone]. Do not use MyChart messaging for urgent concerns.    DIET:  We do recommend a small meal at first, but  then you may proceed to your regular diet.  Drink plenty of fluids but you should avoid alcoholic beverages for 24 hours.  ACTIVITY:  You should plan to take it easy for the rest of today and you should NOT DRIVE or use heavy machinery until tomorrow (because of the sedation medicines used during the test).    FOLLOW UP: Our staff will call the number listed on your records 48-72 hours following your procedure to check on you and address any questions or concerns that you may have regarding the information given to you following your procedure. If we do not reach you, we will leave a message.  We will attempt to reach you two times.  During this call, we will ask if you have developed any symptoms of COVID 19. If you develop any symptoms (ie: fever, flu-like symptoms, shortness of breath, cough etc.) before then, please call [phone].  If you test positive for Covid 19 in the 2 weeks post procedure, please call and report this information to us.    If any biopsies were taken you will be contacted by phone or by letter within the next 1-3 weeks.  Please call us at [phone] if you have not heard about the biopsies in 3 weeks.    SIGNATURES/CONFIDENTIALITY: You and/or your care partner have signed paperwork which will be entered into your electronic medical record.  These signatures attest to the fact that that the information above on   your After Visit Summary has been reviewed and is understood.  Full responsibility of the confidentiality of this discharge information lies with you and/or your care-partner. 

## 2021-02-06 NOTE — Op Note (Signed)
[place] Patient Name: James Burton Procedure Date: 02/06/2021 10:42 AM MRN: [id number] Endoscopist: Gerrit Heck , MD Age: 50 Referring MD:  Date of Birth: 1971-02-07 Gender: Male Account #: [id number] Procedure:                Colonoscopy Indications:              Screening for colorectal malignant neoplasm, This                            is the patient's first colonoscopy Medicines:                Monitored Anesthesia Care Procedure:                Pre-Anesthesia Assessment:                           - Prior to the procedure, a History and Physical                            was performed, and patient medications and                            allergies were reviewed. The patient's tolerance of                            previous anesthesia was also reviewed. The risks                            and benefits of the procedure and the sedation                            options and risks were discussed with the patient.                            All questions were answered, and informed consent                            was obtained. Prior Anticoagulants: The patient has                            taken no previous anticoagulant or antiplatelet                            agents. ASA Grade Assessment: II - A patient with                            mild systemic disease. After reviewing the risks                            and benefits, the patient was deemed in                            satisfactory condition to undergo the procedure.  After obtaining informed consent, the colonoscope                            was passed under direct vision. Throughout the                            procedure, the patient's blood pressure, pulse, and                            oxygen saturations were monitored continuously. The                            Olympus CF-HQ190 [id number]) Colonoscope was                            introduced through the anus  and advanced to the the                            cecum, identified by appendiceal orifice and                            ileocecal valve. The colonoscopy was performed                            without difficulty. The patient tolerated the                            procedure well. The quality of the bowel                            preparation was good. The terminal ileum, ileocecal                            valve, appendiceal orifice, and rectum were                            photographed. Scope In: 11:00:32 AM Scope Out: 11:17:22 AM Scope Withdrawal Time: 0 hours 14 minutes 27 seconds  Total Procedure Duration: 0 hours 16 minutes 50 seconds  Findings:                 The perianal and digital rectal examinations were                            normal.                           Two sessile polyps were found in the sigmoid colon                            and transverse colon. The polyps were 3 to 4 mm in                            size. These polyps were removed with a cold snare.  Resection and retrieval were complete. Estimated                            blood loss was minimal.                           A few small-mouthed diverticula were found in the                            sigmoid colon.                           Non-bleeding internal hemorrhoids were found during                            retroflexion. The hemorrhoids were small.                           The terminal ileum appeared normal. Complications:            No immediate complications. Estimated Blood Loss:     Estimated blood loss was minimal. Impression:               - Two 3 to 4 mm polyps in the sigmoid colon and in                            the transverse colon, removed with a cold snare.                            Resected and retrieved.                           - Diverticulosis in the sigmoid colon.                           - Non-bleeding internal hemorrhoids.                            - The examined portion of the ileum was normal. Recommendation:           - Patient has a contact number available for                            emergencies. The signs and symptoms of potential                            delayed complications were discussed with the                            patient. Return to normal activities tomorrow.                            Written discharge instructions were provided to the                            patient.                           -  Resume previous diet.                           - Continue present medications.                           - Await pathology results.                           - Repeat colonoscopy for surveillance based on                            pathology results.                           - Return to GI office PRN.                           - Use fiber, for example Citrucel, Fibercon, Konsyl                            or Metamucil. Gerrit Heck, MD 02/06/2021 11:27:27 AM

## 2021-02-10 ENCOUNTER — Telehealth: Payer: Self-pay

## 2021-02-10 NOTE — Telephone Encounter (Signed)
  Follow up Call-  Call back number 02/06/2021  Post procedure Call Back phone  # [phone]  Permission to leave phone message Yes  Some recent data might be hidden     Patient questions:  Do you have a fever, pain , or abdominal swelling? No. Pain Score  0 *  Have you tolerated food without any problems? Yes.    Have you been able to return to your normal activities? Yes.    Do you have any questions about your discharge instructions: Diet   No. Medications  No. Follow up visit  No.  Do you have questions or concerns about your Care? No.  Actions: * If pain score is 4 or above: No action needed, pain <4. 1. Have you developed a fever since your procedure? no  2.   Have you had an respiratory symptoms (SOB or cough) since your procedure? no  3.   Have you tested positive for COVID 19 since your procedure no  4.   Have you had any family members/close contacts diagnosed with the COVID 19 since your procedure?  no   If yes to any of these questions please route to Joylene John, RN and Joella Prince, RN

## 2021-02-12 ENCOUNTER — Encounter: Payer: Self-pay | Admitting: Family

## 2021-02-13 ENCOUNTER — Other Ambulatory Visit: Payer: Self-pay

## 2021-02-13 ENCOUNTER — Encounter: Payer: Self-pay | Admitting: Gastroenterology

## 2021-02-13 ENCOUNTER — Ambulatory Visit
Admission: RE | Admit: 2021-02-13 | Discharge: 2021-02-13 | Disposition: A | Discharge status: Home / Self Care | Payer: BC Managed Care – PPO | Source: Ambulatory Visit | Attending: Neurological Surgery | Admitting: Neurological Surgery

## 2021-02-13 DIAGNOSIS — M47812 Spondylosis without myelopathy or radiculopathy, cervical region: Secondary | ICD-10-CM | POA: Diagnosis not present

## 2021-02-13 DIAGNOSIS — M5412 Radiculopathy, cervical region: Secondary | ICD-10-CM

## 2021-02-13 MED ORDER — TRIAMCINOLONE ACETONIDE 40 MG/ML IJ SUSP (RADIOLOGY)
60.0000 mg | Freq: Once | INTRAMUSCULAR | Status: AC
  Administered 2021-02-13: 60 mg via EPIDURAL

## 2021-02-13 MED ORDER — DIAZEPAM 5 MG PO TABS
10.0000 mg | ORAL_TABLET | Freq: Once | ORAL | Status: AC
  Administered 2021-02-13: 10 mg via ORAL

## 2021-02-13 MED ORDER — IOPAMIDOL (ISOVUE-M 300) INJECTION 61%
1.0000 mL | Freq: Once | INTRAMUSCULAR | Status: AC
  Administered 2021-02-13: 1 mL via EPIDURAL

## 2021-02-13 NOTE — Discharge Instructions (Signed)
Post Procedure Spinal Discharge Instruction Sheet  1. You may resume a regular diet and any medications that you routinely take (including pain medications).  2. No driving day of procedure.  3. Light activity throughout the rest of the day.  Do not do any strenuous work, exercise, bending or lifting.  The day following the procedure, you can resume normal physical activity but you should refrain from exercising or physical therapy for at least three days thereafter.   Common Side Effects:   Headaches- take your usual medications as directed by your physician.  Increase your fluid intake.  Caffeinated beverages may be helpful.  Lie flat in bed until your headache resolves.   Restlessness or inability to sleep- you may have trouble sleeping for the next few days.  Ask your referring physician if you need any medication for sleep.   Facial flushing or redness- should subside within a few days.   Increased pain- a temporary increase in pain a day or two following your procedure is not unusual.  Take your pain medication as prescribed by your referring physician.   Leg cramps  Please contact our office at [phone] for the following symptoms:  Fever greater than 100 degrees.  Headaches unresolved with medication after 2-3 days.  Increased swelling, pain, or redness at injection site.   Thank you for visiting [facility] today.  

## 2021-02-25 ENCOUNTER — Other Ambulatory Visit: Payer: Self-pay | Admitting: Family

## 2021-05-19 ENCOUNTER — Encounter: Payer: Self-pay | Admitting: Family

## 2021-08-12 DIAGNOSIS — G4733 Obstructive sleep apnea (adult) (pediatric): Secondary | ICD-10-CM | POA: Diagnosis not present

## 2021-09-11 DIAGNOSIS — G4733 Obstructive sleep apnea (adult) (pediatric): Secondary | ICD-10-CM | POA: Diagnosis not present

## 2021-10-06 ENCOUNTER — Encounter: Payer: Self-pay | Admitting: Family

## 2021-10-06 ENCOUNTER — Ambulatory Visit (INDEPENDENT_AMBULATORY_CARE_PROVIDER_SITE_OTHER): Payer: BC Managed Care – PPO | Admitting: Family

## 2021-10-06 VITALS — BP 137/82 | HR 78 | Temp 98.4°F | Resp 16 | Wt 243.0 lb

## 2021-10-06 DIAGNOSIS — K649 Unspecified hemorrhoids: Secondary | ICD-10-CM

## 2021-10-06 NOTE — Patient Instructions (Signed)
Apply hydrocortisone and preparation H cream twice daily. Apply tucks pads as needed. Soak in a sitz bath with Epsom salts twice daily. Drink plenty of water. Keep stools very soft for the next week or two. You can  use miralax one capful daily as needed. Call if symptoms worsen or if symptoms do not improve in 1 week.

## 2021-10-06 NOTE — Telephone Encounter (Signed)
Patient called and scheduled to be seen this afternoon

## 2021-10-06 NOTE — Progress Notes (Signed)
Subjective:     Patient ID: James Burton, male    DOB: March 08, 1971, 51 y.o.   MRN: [id number]  Chief Complaint  Patient presents with   Hemorrhoids    Complains of having a hemorrhoid flare up    HPI Patient is in today for follow up.  Satuday night he noticed some perianal discomfort.Wife noted hemorrhoid.  He had some hydrocortisone cream, also applying preparation-H.    Health Maintenance Due  Topic Date Due   HIV Screening  Never done   Hepatitis C Screening  Never done   COVID-19 Vaccine (3 - Booster for Pfizer series) 09/09/2020   Zoster Vaccines- Shingrix (1 of 2) Never done   INFLUENZA VACCINE  04/13/2021    Past Medical History:  Diagnosis Date   Allergy    seasonal allergies   Bilateral inguinal hernia    Cervical herniated disc    GERD (gastroesophageal reflux disease)    on meds   OSA (obstructive sleep apnea) 09/25/2014   Moderate OSA per home study 3/16    Plantar fasciitis    hx of   Sleep apnea    use CPAP    Past Surgical History:  Procedure Laterality Date   COLONOSCOPY  02/06/2021   Gerrit Heck at [facility]  2004   WISDOM TOOTH EXTRACTION      Family History  Problem Relation Age of Onset   Diabetes Father    Diverticulitis Father    Cancer Maternal Grandfather        bone, skin   Colon cancer Neg Hx    Colon polyps Neg Hx    Esophageal cancer Neg Hx    Stomach cancer Neg Hx    Rectal cancer Neg Hx     Social History   Socioeconomic History   Marital status: Married    Spouse name: Not on file   Number of children: Not on file   Years of education: Not on file   Highest education level: Not on file  Occupational History   Occupation: Patent examiner  Tobacco Use   Smoking status: Never   Smokeless tobacco: Never  Vaping Use   Vaping Use: Never used  Substance and Sexual Activity   Alcohol use: No   Drug use: No   Sexual activity: Not on file  Other Topics Concern   Not on file  Social History Narrative    2 boys- ages 31 and 70   Married   Enjoys Designer, fashion/clothing   Completed 4 yrs of Media planner schooling   Works as Facilities manager   Social Determinants of Radio broadcast assistant Strain: Not on Art therapist Insecurity: Not on file  Transportation Needs: Not on file  Physical Activity: Not on file  Stress: Not on file  Social Connections: Not on file  Intimate Partner Violence: Not on file    Outpatient Medications Prior to Visit  Medication Sig Dispense Refill   cyclobenzaprine (FLEXERIL) 10 MG tablet Take 10 mg by mouth 3 (three) times daily as needed for muscle spasms.     EPINEPHrine 0.3 mg/0.3 mL IJ SOAJ injection Use as needed for anaphylaxis, allergic reactions     omeprazole (PRILOSEC) 20 MG capsule TAKE 1 CAPSULE DAILY 90 capsule 1   tadalafil (CIALIS) 5 MG tablet 1-2 tablets by mouth 30 minutes prior to sexual activity 30 tablet 1   traMADol (ULTRAM) 50 MG tablet Take 50 mg by mouth every 6 (six) hours as needed.  No facility-administered medications prior to visit.    Allergies  Allergen Reactions   Phenylephrine Hcl     Other reaction(s): rash   Sudafed Pe [Phenylephrine] Other (See Comments)    Rash on palms    ROS See HPI    Objective:    Physical Exam Constitutional:      Appearance: Normal appearance.  HENT:     Head: Normocephalic and atraumatic.  Cardiovascular:     Rate and Rhythm: Normal rate.  Pulmonary:     Effort: Pulmonary effort is normal.  Genitourinary:    Comments: + external hemorrhoid noted Neurological:     Mental Status: He is alert.    BP 137/82 (BP Location: Right Arm, Patient Position: Sitting, Cuff Size: Large)    Pulse 78    Temp 98.4 F (36.9 C) (Oral)    Resp 16    Wt 243 lb (110.2 kg)    SpO2 97%    BMI 32.06 kg/m  Wt Readings from Last 3 Encounters:  10/06/21 243 lb (110.2 kg)  02/06/21 235 lb (106.6 kg)  01/23/21 235 lb (106.6 kg)       Assessment & Plan:   Problem List Items Addressed This Visit        Unprioritized   Hemorrhoids - Primary    New.  Advised pt as follows:  Apply hydrocortisone and preparation H cream twice daily. Apply tucks pads as needed. Soak in a sitz bath with Epsom salts twice daily. Drink plenty of water. Keep stools very soft for the next week or two. You can  use miralax one capful daily as needed. Call if symptoms worsen or if symptoms do not improve in 1 week.        I am having James Burton maintain his EPINEPHrine, traMADol, tadalafil, cyclobenzaprine, and omeprazole.  No orders of the defined types were placed in this encounter.

## 2021-10-07 DIAGNOSIS — K649 Unspecified hemorrhoids: Secondary | ICD-10-CM | POA: Insufficient documentation

## 2021-10-07 NOTE — Assessment & Plan Note (Signed)
New.  Advised pt as follows:  Apply hydrocortisone and preparation H cream twice daily. Apply tucks pads as needed. Soak in a sitz bath with Epsom salts twice daily. Drink plenty of water. Keep stools very soft for the next week or two. You can  use miralax one capful daily as needed. Call if symptoms worsen or if symptoms do not improve in 1 week.

## 2021-10-12 DIAGNOSIS — G4733 Obstructive sleep apnea (adult) (pediatric): Secondary | ICD-10-CM | POA: Diagnosis not present

## 2021-11-11 DIAGNOSIS — G4733 Obstructive sleep apnea (adult) (pediatric): Secondary | ICD-10-CM | POA: Diagnosis not present

## 2021-11-16 DIAGNOSIS — L820 Inflamed seborrheic keratosis: Secondary | ICD-10-CM | POA: Diagnosis not present

## 2021-11-16 DIAGNOSIS — D225 Melanocytic nevi of trunk: Secondary | ICD-10-CM | POA: Diagnosis not present

## 2021-11-16 DIAGNOSIS — L570 Actinic keratosis: Secondary | ICD-10-CM | POA: Diagnosis not present

## 2021-11-16 DIAGNOSIS — L821 Other seborrheic keratosis: Secondary | ICD-10-CM | POA: Diagnosis not present

## 2021-12-12 DIAGNOSIS — G4733 Obstructive sleep apnea (adult) (pediatric): Secondary | ICD-10-CM | POA: Diagnosis not present

## 2022-01-11 DIAGNOSIS — G4733 Obstructive sleep apnea (adult) (pediatric): Secondary | ICD-10-CM | POA: Diagnosis not present

## 2022-01-25 ENCOUNTER — Telehealth: Payer: Self-pay

## 2022-01-25 ENCOUNTER — Other Ambulatory Visit (HOSPITAL_BASED_OUTPATIENT_CLINIC_OR_DEPARTMENT_OTHER): Payer: Self-pay

## 2022-01-25 ENCOUNTER — Ambulatory Visit (INDEPENDENT_AMBULATORY_CARE_PROVIDER_SITE_OTHER): Payer: BC Managed Care – PPO | Admitting: Family

## 2022-01-25 ENCOUNTER — Telehealth: Payer: Self-pay | Admitting: Family

## 2022-01-25 VITALS — BP 122/74 | HR 71 | Temp 98.4°F | Resp 16 | Wt 241.0 lb

## 2022-01-25 DIAGNOSIS — M19041 Primary osteoarthritis, right hand: Secondary | ICD-10-CM

## 2022-01-25 DIAGNOSIS — M19042 Primary osteoarthritis, left hand: Secondary | ICD-10-CM

## 2022-01-25 DIAGNOSIS — H109 Unspecified conjunctivitis: Secondary | ICD-10-CM

## 2022-01-25 DIAGNOSIS — N529 Male erectile dysfunction, unspecified: Secondary | ICD-10-CM

## 2022-01-25 DIAGNOSIS — Z8349 Family history of other endocrine, nutritional and metabolic diseases: Secondary | ICD-10-CM

## 2022-01-25 MED ORDER — CIPROFLOXACIN HCL 0.3 % OP SOLN
1.0000 [drp] | OPHTHALMIC | 0 refills | Status: AC
  Filled 2022-01-25: qty 2.5, 5d supply, fill #0

## 2022-01-25 MED ORDER — TADALAFIL 20 MG PO TABS
10.0000 mg | ORAL_TABLET | ORAL | 11 refills | Status: DC | PRN
  Filled 2022-01-25: qty 10, 20d supply, fill #0
  Filled 2022-05-09 – 2022-05-11 (×2): qty 10, 20d supply, fill #1

## 2022-01-25 NOTE — Telephone Encounter (Signed)
Appt today w/ PCP.  

## 2022-01-25 NOTE — Telephone Encounter (Signed)
Nurse Assessment ?Nurse: Redmond Pulling, RN, Levada Dy Date/Time Eilene Ghazi Time): 01/25/2022 8:43:57 AM ?Confirm and document reason for call. If ?symptomatic, describe symptoms. ?---Caller states he is calling to schedule an ?appointment. His eyes have been itching for a few ?days, the white area of his left eye has little blister on ?it. Right eye just itches, no blisters. Symptoms started ?Saturday. ?Does the patient have any new or worsening ?symptoms? ---Yes ?Will a triage be completed? ---Yes ?Related visit to physician within the last 2 weeks? ---No ?Does the PT have any chronic conditions? (i.e. ?diabetes, asthma, this includes High risk factors for ?pregnancy, etc.) ?---No ?Is this a behavioral health or substance abuse call? ---No ?Guidelines ?Guideline Title Affirmed Question Affirmed Notes Nurse Date/Time (Eastern ?Time) ?Eye - Allergy [1] Blurred vision ?AND [2] new or ?worsening ?Redmond Pulling, RN, Levada Dy 01/25/2022 8:46:42 ?AM ?Disp. Time (Eastern ?Time) Disposition Final User ?01/25/2022 8:49:19 AM See HCP within 4 Hours (or ?PCP triage) ?Yes Redmond Pulling, RN, Levada Dy ?PLEASE NOTE: All timestamps contained within this report are represented as [place] Standard Time. ?CONFIDENTIALTY NOTICE: This fax transmission is intended only for the addressee. It contains information that is legally privileged, confidential or ?otherwise protected from use or disclosure. If you are not the intended recipient, you are strictly prohibited from reviewing, disclosing, copying using ?or disseminating any of this information or taking any action in reliance on or regarding this information. If you have received this fax in error, please ?notify us immediately by telephone so that we can arrange for its return to [place]. Phone: [phone], Toll-Free: [phone], Fax: [phone] ?Page: 2 of 2 ?Call Id: [id number] ?Caller Disagree/Comply Comply ?Caller Understands Yes ?PreDisposition Did not know what to do ?Care Advice Given Per Guideline ?SEE HCP  (OR PCP TRIAGE) WITHIN 4 HOURS: * IF OFFICE WILL BE OPEN: You need to be seen within the next 3 or 4 ?hours. Call your doctor (or NP/PA) now or as soon as the office opens. [place] ALLERGENS OUT OF EYES: * Briefly and gently ?rinse (irrigate) each eye with lukewarm water for 15-30 seconds. APPLY A COLD COMPRESS: * Apply a cold compress (e.g., a ?cool moist washcloth) to the eyelids for 10 minutes several times a day. * This will help decrease the eye itching. * If they are still ?itchy or bloodshot, you can use over-the-counter antihistamine/vasoconstrictor eyedrops (e.g., Naphcon-A, Visine-A, Vasocon). * ?You become worse CALL BACK IF: CARE ADVICE given per Eye - Allergy (Adult) guideline. ?Referrals ?REFERRED TO PCP OFFICE ?

## 2022-01-25 NOTE — Assessment & Plan Note (Addendum)
New. He brings with him a picture of a clear blister overlying the left lateral sclera. Notes that he had been rubbing his eye a lot at that time. Did note some improvement with an otc allergy drop.  I do question possibility of bacterial conjunctivitis as his symptoms are unilateral. Will rx with ciloxan drops.  ?

## 2022-01-25 NOTE — Assessment & Plan Note (Signed)
Sister who is 68 years younger than he is was recently diagnosed with McArdle's disease. He is iinterested in getting tested.  I will reach out to our genetic counselors and see if this is something they can do or if they can recommend an outside center.  ?

## 2022-01-25 NOTE — Assessment & Plan Note (Signed)
He feels that cialis '10mg'$  is not strong enough at times. Will give trial of '20mg'$ .  ?

## 2022-01-25 NOTE — Progress Notes (Addendum)
? ?Subjective:  ? ?By signing my name below, I, Shehryar Baig, attest that this documentation has been prepared under the direction and in the presence of [name] NP. 01/25/2022 ? ? ? Patient ID: James Burton, male    DOB: Oct 04, 1970, 51 y.o.   MRN: [id number] ? ?Chief Complaint  ?Patient presents with  ? Eye Problem  ?  Complains of left eye drainage and irritation  ? ? ?Eye Problem  ?Associated symptoms include an eye discharge (Left eye) and eye redness (left eye).  ?Patient is in today for a office visit.  ? ?Left eye irritation- He complains of drainage and irritation on his left eye since Saturday, 01/23/2022. He also reports a blister on his left eye. He is rubbing on his eye. His symptoms were worse yesterday. No one in his family has pink eye. He has tried OTC eye drops this morning and found relief.  ?Finger pain and swelling- He continues having pain and swelling in his finger joints due to arthritis. He occasionally has burning pain. He is managing it on his own at this time.  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 His sister is 13 years younger than him. He feels tired slightly more often but thinks it is due to aging. Otherwise he is not experiencing any other symptoms. He is willing to see a Dietitian for further analysis.  ? ? ?Health Maintenance Due  ?Topic Date Due  ? HIV Screening  Never done  ? Hepatitis C Screening  Never done  ? COVID-19 Vaccine (3 - Booster for Pfizer series) 09/09/2020  ? Zoster Vaccines- Shingrix (1 of 2) Never done  ? ? ?Past Medical History:  ?Diagnosis Date  ? Allergy   ? seasonal allergies  ? Bilateral inguinal hernia   ? Cervical herniated disc   ? GERD (gastroesophageal reflux disease)   ? on meds  ? OSA (obstructive sleep apnea) 09/25/2014  ? Moderate OSA per home study 3/16   ? Plantar fasciitis   ? hx of  ? Sleep apnea   ? use CPAP  ? ? ?Past Surgical History:   ?Procedure Laterality Date  ? COLONOSCOPY  02/06/2021  ? [facility] Cirigliano at [facility]  ? VASECTOMY  2004  ? WISDOM TOOTH EXTRACTION    ? ? ?Family History  ?Problem Relation Age of Onset  ? Diabetes Father   ? Diverticulitis Father   ? Cancer Maternal Grandfather   ?     bone, skin  ? Colon cancer Neg Hx   ? Colon polyps Neg Hx   ? Esophageal cancer Neg Hx   ? Stomach cancer Neg Hx   ? Rectal cancer Neg Hx   ? ? ?Social History  ? ?Socioeconomic History  ? Marital status: Married  ?  Spouse name: Not on file  ? Number of children: Not on file  ? Years of education: Not on file  ? Highest education level: Not on file  ?Occupational History  ? Occupation: Patent examiner  ?Tobacco Use  ? Smoking status: Never  ? Smokeless tobacco: Never  ?Vaping Use  ? Vaping Use: Never used  ?Substance and Sexual Activity  ? Alcohol use: No  ? Drug use: No  ? Sexual activity: Not on file  ?Other Topics Concern  ? Not on file  ?Social History Narrative  ? 2 boys- ages 74 and 44  ? Married  ? Enjoys drag  racing  ? Completed 4 yrs of elevator schooling  ? Works as Facilities manager  ? ?Social Determinants of Health  ? ?Financial Resource Strain: Not on file  ?Food Insecurity: Not on file  ?Transportation Needs: Not on file  ?Physical Activity: Not on file  ?Stress: Not on file  ?Social Connections: Not on file  ?Intimate Partner Violence: Not on file  ? ? ?Outpatient Medications Prior to Visit  ?Medication Sig Dispense Refill  ? cyclobenzaprine (FLEXERIL) 10 MG tablet Take 10 mg by mouth 3 (three) times daily as needed for muscle spasms.    ? EPINEPHrine 0.3 mg/0.3 mL IJ SOAJ injection Use as needed for anaphylaxis, allergic reactions    ? omeprazole (PRILOSEC) 20 MG capsule TAKE 1 CAPSULE DAILY 90 capsule 1  ? traMADol (ULTRAM) 50 MG tablet Take 50 mg by mouth every 6 (six) hours as needed.    ? tadalafil (CIALIS) 5 MG tablet 1-2 tablets by mouth 30 minutes prior to sexual activity 30 tablet 1  ? ?No facility-administered medications prior  to visit.  ? ? ?Allergies  ?Allergen Reactions  ? Phenylephrine Hcl   ?  Other reaction(s): rash  ? Sudafed Pe [Phenylephrine] Other (See Comments)  ?  Rash on palms  ? ? ?Review of Systems  ?Eyes:  Positive for discharge (Left eye) and redness (left eye).  ?     (+)blister on left eye  ? ?   ?Objective:  ?  ?Physical Exam ?Constitutional:   ?   General: He is not in acute distress. ?   Appearance: Normal appearance. He is not ill-appearing.  ?HENT:  ?   Head: Normocephalic and atraumatic.  ?   Right Ear: External ear normal.  ?   Left Ear: External ear normal.  ?Eyes:  ?   Extraocular Movements: Extraocular movements intact.  ?   Conjunctiva/sclera:  ?   Left eye: Left conjunctiva is injected. No exudate or hemorrhage. ?   Pupils: Pupils are equal, round, and reactive to light.  ?Cardiovascular:  ?   Rate and Rhythm: Normal rate.  ?Pulmonary:  ?   Effort: Pulmonary effort is normal.  ?   Breath sounds: No rales.  ?Skin: ?   General: Skin is warm and dry.  ?Neurological:  ?   Mental Status: He is alert and oriented to person, place, and time.  ?Psychiatric:     ?   Judgment: Judgment normal.  ? ? ?BP 122/74 (BP Location: Right Arm, Patient Position: Sitting, Cuff Size: Large)   Pulse 71   Temp 98.4 ?F (36.9 ?C) (Oral)   Resp 16   Wt 241 lb (109.3 kg)   SpO2 98%   BMI 31.80 kg/m?  ?Wt Readings from Last 3 Encounters:  ?01/25/22 241 lb (109.3 kg)  ?10/06/21 243 lb (110.2 kg)  ?02/06/21 235 lb (106.6 kg)  ? ? ?   ?Assessment & Plan:  ? ?Problem List Items Addressed This Visit   ? ?  ? Unprioritized  ? Osteoarthritis of both hands - Primary  ?  Discussed that unfortunately there is no cure for this. Will monitor.  ? ?  ?  ? Family history of glycogen storage disease  ?  Sister who is 26 years younger than he is was recently diagnosed with McArdle's disease. He is iinterested in getting tested.  I will reach out to our genetic counselors and see if this is something they can do or if they can recommend an outside  center.  ? ?  ?  ?  ED (erectile dysfunction)  ?  He feels that cialis '10mg'$  is not strong enough at times. Will give trial of '20mg'$ .  ? ?  ?  ? Conjunctivitis  ?  New. He brings with him a picture of a clear blister overlying the left lateral sclera. Notes that he had been rubbing his eye a lot at that time. Did note some improvement with an otc allergy drop.  I do question possibility of bacterial conjunctivitis as his symptoms are unilateral. Will rx with ciloxan drops.  ? ?  ?  ? ? ? ?Meds ordered this encounter  ?Medications  ? ciprofloxacin (CILOXAN) 0.3 % ophthalmic solution  ?  Sig: Place 1 drop into both eyes every 2 hours while awake for 2 days, then place 1 drop every 4 hours while awake for the next 5 days.  ?  Dispense:  5 mL  ?  Refill:  0  ?  Order Specific Question:   Supervising Provider  ?  Answer:   Penni Homans A [[id number]]  ? tadalafil (CIALIS) 20 MG tablet  ?  Sig: Take 1/2 - 1 tablet (10-20 mg total) by mouth every other day as needed for erectile dysfunction.  ?  Dispense:  10 tablet  ?  Refill:  11  ?  Order Specific Question:   Supervising Provider  ?  Answer:   Penni Homans A [[id number]]  ? ? ?I, Nance Pear, NP, personally preformed the services described in this documentation.  All medical record entries made by the scribe were at my direction and in my presence.  I have reviewed the chart and discharge instructions (if applicable) and agree that the record reflects my personal performance and is accurate and complete. 01/25/2022 ? ? ?Engineering geologist as a Education administrator for [employer], NP.,have documented all relevant documentation on the behalf of Nance Pear, NP,as directed by  Nance Pear, NP while in the presence of Nance Pear, NP. ? ? ?Nance Pear, NP ? ?

## 2022-01-25 NOTE — Assessment & Plan Note (Signed)
Discussed that unfortunately there is no cure for this. Will monitor.  ?

## 2022-01-25 NOTE — Telephone Encounter (Signed)
See mychart.  

## 2022-01-25 NOTE — Patient Instructions (Signed)
Please start cipro drops. Call if eye irritation worsens or if it does not improve.  ?

## 2022-02-09 DIAGNOSIS — G4733 Obstructive sleep apnea (adult) (pediatric): Secondary | ICD-10-CM | POA: Diagnosis not present

## 2022-02-11 DIAGNOSIS — G4733 Obstructive sleep apnea (adult) (pediatric): Secondary | ICD-10-CM | POA: Diagnosis not present

## 2022-03-12 DIAGNOSIS — G4733 Obstructive sleep apnea (adult) (pediatric): Secondary | ICD-10-CM | POA: Diagnosis not present

## 2022-04-11 DIAGNOSIS — G4733 Obstructive sleep apnea (adult) (pediatric): Secondary | ICD-10-CM | POA: Diagnosis not present

## 2022-05-11 ENCOUNTER — Other Ambulatory Visit: Payer: Self-pay | Admitting: Family

## 2022-05-11 ENCOUNTER — Other Ambulatory Visit (HOSPITAL_BASED_OUTPATIENT_CLINIC_OR_DEPARTMENT_OTHER): Payer: Self-pay

## 2022-05-11 ENCOUNTER — Encounter: Payer: Self-pay | Admitting: Family

## 2022-05-11 MED ORDER — TADALAFIL 20 MG PO TABS
10.0000 mg | ORAL_TABLET | ORAL | 4 refills | Status: DC | PRN
  Filled 2022-05-11: qty 10, 20d supply, fill #0

## 2022-05-11 MED ORDER — TADALAFIL 20 MG PO TABS: 10.0000 mg | ORAL_TABLET | ORAL | 4 refills | Status: DC | PRN

## 2022-05-14 DIAGNOSIS — G4733 Obstructive sleep apnea (adult) (pediatric): Secondary | ICD-10-CM | POA: Diagnosis not present

## 2022-05-19 ENCOUNTER — Other Ambulatory Visit (HOSPITAL_BASED_OUTPATIENT_CLINIC_OR_DEPARTMENT_OTHER): Payer: Self-pay

## 2022-05-19 ENCOUNTER — Other Ambulatory Visit: Payer: Self-pay

## 2022-05-19 MED ORDER — TADALAFIL 20 MG PO TABS
10.0000 mg | ORAL_TABLET | ORAL | 10 refills | Status: DC | PRN
  Filled 2022-05-19: qty 15, 30d supply, fill #0
  Filled 2022-08-17: qty 8, 16d supply, fill #0
  Filled 2022-08-17: qty 7, 14d supply, fill #0
  Filled 2022-08-17: qty 15, 30d supply, fill #0
  Filled 2022-10-22: qty 15, 30d supply, fill #1
  Filled 2023-01-31: qty 15, 30d supply, fill #2

## 2022-05-19 NOTE — Telephone Encounter (Signed)
Rx sent to [facility] for 15 tabs with 10 refills

## 2022-06-10 DIAGNOSIS — G4733 Obstructive sleep apnea (adult) (pediatric): Secondary | ICD-10-CM | POA: Diagnosis not present

## 2022-06-15 DIAGNOSIS — L570 Actinic keratosis: Secondary | ICD-10-CM | POA: Diagnosis not present

## 2022-06-15 DIAGNOSIS — I788 Other diseases of capillaries: Secondary | ICD-10-CM | POA: Diagnosis not present

## 2022-06-15 DIAGNOSIS — L821 Other seborrheic keratosis: Secondary | ICD-10-CM | POA: Diagnosis not present

## 2022-07-10 DIAGNOSIS — G4733 Obstructive sleep apnea (adult) (pediatric): Secondary | ICD-10-CM | POA: Diagnosis not present

## 2022-07-28 ENCOUNTER — Encounter: Payer: BC Managed Care – PPO | Admitting: Family

## 2022-07-28 DIAGNOSIS — M542 Cervicalgia: Secondary | ICD-10-CM | POA: Diagnosis not present

## 2022-07-28 DIAGNOSIS — M5412 Radiculopathy, cervical region: Secondary | ICD-10-CM | POA: Diagnosis not present

## 2022-08-08 DIAGNOSIS — G4733 Obstructive sleep apnea (adult) (pediatric): Secondary | ICD-10-CM | POA: Diagnosis not present

## 2022-08-17 ENCOUNTER — Encounter: Payer: Self-pay | Admitting: Family

## 2022-08-17 ENCOUNTER — Other Ambulatory Visit (HOSPITAL_BASED_OUTPATIENT_CLINIC_OR_DEPARTMENT_OTHER): Payer: Self-pay

## 2022-08-17 ENCOUNTER — Ambulatory Visit (INDEPENDENT_AMBULATORY_CARE_PROVIDER_SITE_OTHER): Payer: BC Managed Care – PPO | Admitting: Family

## 2022-08-17 VITALS — BP 117/76 | HR 69 | Temp 98.1°F | Resp 16 | Ht 73.5 in | Wt 239.0 lb

## 2022-08-17 DIAGNOSIS — M542 Cervicalgia: Secondary | ICD-10-CM

## 2022-08-17 DIAGNOSIS — N529 Male erectile dysfunction, unspecified: Secondary | ICD-10-CM | POA: Diagnosis not present

## 2022-08-17 DIAGNOSIS — Z87898 Personal history of other specified conditions: Secondary | ICD-10-CM | POA: Diagnosis not present

## 2022-08-17 DIAGNOSIS — Z0000 Encounter for general adult medical examination without abnormal findings: Secondary | ICD-10-CM

## 2022-08-17 LAB — CBC WITH DIFFERENTIAL/PLATELET
Basophils Absolute: 0 10*3/uL (ref 0.0–0.1)
Basophils Relative: 0.7 % (ref 0.0–3.0)
Eosinophils Absolute: 0.3 10*3/uL (ref 0.0–0.7)
Eosinophils Relative: 4.2 % (ref 0.0–5.0)
HCT: 43.1 % (ref 39.0–52.0)
Hemoglobin: 14.7 g/dL (ref 13.0–17.0)
Lymphocytes Relative: 19 % (ref 12.0–46.0)
Lymphs Abs: 1.4 10*3/uL (ref 0.7–4.0)
MCHC: 34.2 g/dL (ref 30.0–36.0)
MCV: 86.4 fl (ref 78.0–100.0)
Monocytes Absolute: 0.6 10*3/uL (ref 0.1–1.0)
Monocytes Relative: 7.7 % (ref 3.0–12.0)
Neutro Abs: 5.1 10*3/uL (ref 1.4–7.7)
Neutrophils Relative %: 68.4 % (ref 43.0–77.0)
Platelets: 182 10*3/uL (ref 150.0–400.0)
RBC: 4.99 Mil/uL (ref 4.22–5.81)
RDW: 12.9 % (ref 11.5–15.5)
WBC: 7.4 10*3/uL (ref 4.0–10.5)

## 2022-08-17 LAB — COMPREHENSIVE METABOLIC PANEL
ALT: 35 U/L (ref 0–53)
AST: 23 U/L (ref 0–37)
Albumin: 4.6 g/dL (ref 3.5–5.2)
Alkaline Phosphatase: 88 U/L (ref 39–117)
BUN: 24 mg/dL — ABNORMAL HIGH (ref 6–23)
CO2: 28 mEq/L (ref 19–32)
Calcium: 9 mg/dL (ref 8.4–10.5)
Chloride: 104 mEq/L (ref 96–112)
Creatinine, Ser: 1.37 mg/dL (ref 0.40–1.50)
GFR: 59.68 mL/min — ABNORMAL LOW (ref >60.00)
Glucose, Bld: 91 mg/dL (ref 70–99)
Potassium: 4.3 mEq/L (ref 3.5–5.1)
Sodium: 141 mEq/L (ref 135–145)
Total Bilirubin: 0.4 mg/dL (ref 0.2–1.2)
Total Protein: 6.7 g/dL (ref 6.0–8.3)

## 2022-08-17 LAB — LIPID PANEL
Cholesterol: 190 mg/dL (ref 0–200)
HDL: 32.2 mg/dL — ABNORMAL LOW (ref >39.00)
NonHDL: 158.29
Total CHOL/HDL Ratio: 6
Triglycerides: 209 mg/dL — ABNORMAL HIGH (ref 0.0–149.0)
VLDL: 41.8 mg/dL — ABNORMAL HIGH (ref 0.0–40.0)

## 2022-08-17 LAB — TSH: TSH: 2 u[IU]/mL (ref 0.35–5.50)

## 2022-08-17 LAB — LDL CHOLESTEROL, DIRECT: Direct LDL: 146 mg/dL

## 2022-08-17 MED ORDER — EPINEPHRINE 0.3 MG/0.3ML IJ SOAJ
0.3000 mg | INTRAMUSCULAR | 1 refills | Status: DC | PRN
  Filled 2022-08-17: qty 2, 30d supply, fill #0

## 2022-08-17 NOTE — Assessment & Plan Note (Addendum)
Wt Readings from Last 3 Encounters:  08/17/22 239 lb (108.4 kg)  01/25/22 241 lb (109.3 kg)  10/06/21 243 lb (110.2 kg)   Started yoga, continues to work on [employer], exercise, weight loss. PSA and colo up to date. Encouraged pt to get covid vaccine at the pharmacy.  He will consider shingrix but wishes to do more research first.

## 2022-08-17 NOTE — Assessment & Plan Note (Addendum)
Good response to Cialis. Continue 10-'20mg'$  prn.

## 2022-08-17 NOTE — Assessment & Plan Note (Addendum)
No recurrences. Update epipen.

## 2022-08-17 NOTE — Progress Notes (Addendum)
Subjective:   By signing my name below, I, Carylon Perches, attest that this documentation has been prepared under the direction and in the presence of Karie Chimera, NP 08/17/2022   Patient ID: James Burton, male    DOB: Jun 24, 1971, 51 y.o.   MRN: [id number]  No chief complaint on file.   HPI Patient is in today for a comprehensive physical exam  Refill: He is requesting a refill of 0.3 mg/0.3 mL of Epinephrine.   Sinus Infection: He reports of a sinus infection a few weeks ago. He spoke to a specialist, received antibiotics and reports that symptoms are now resolved.  Reflux: He states that his reflux depends on what he eats, therefore, he takes 20 mg of Omeprazole PRN.   ED: He is currently taking 1/2 tablet of 20 mg of Cialis. He denies of any significant side effects  Epipen: He states that his Epinephrine Injections are expired. He was seen by an allergist and the cause of his allergy symptoms are unknown.   He denies having any fever new muscle pain, joint pain , new moles, rashes, congestion, sinus pain, sore throat, palpations, cough, SOB ,wheezing,n/v/d constipation, blood in stool, dysuria, frequency, hematuria, depression, anxiety, headaches at this time  Social history: He reports no recent surgeries. He denies of any changes to his family medical history.  PSA: Last completed on 11/04/2020 Colonoscopy: Last completed on 02/06/2021  Immunizations: He is UTD on the tetanus and Covid vaccine. He is overdue for the Shingles vaccine. He is not interested in receiving an a HIV/HepC screening at this time.  Diet: He is trying to maintaining a healthy diet Wt Readings from Last 3 Encounters:  08/17/22 239 lb (108.4 kg)  01/25/22 241 lb (109.3 kg)  10/06/21 243 lb (110.2 kg)   Exercise: He has started doing yoga. He begun to do yoga per Dr.Elsner's suggestion.  Dental: He is UTD on dental exams  Vision: He is UTD on vision exams    Health Maintenance Due   Topic Date Due   Zoster Vaccines- Shingrix (1 of 2) Never done   COVID-19 Vaccine (3 - 2023-24 season) 05/14/2022    Past Medical History:  Diagnosis Date   Allergy    seasonal allergies   Bilateral inguinal hernia    Cervical herniated disc    GERD (gastroesophageal reflux disease)    on meds   OSA (obstructive sleep apnea) 09/25/2014   Moderate OSA per home study 3/16    Plantar fasciitis    hx of   Sleep apnea    use CPAP    Past Surgical History:  Procedure Laterality Date   COLONOSCOPY  02/06/2021   Gerrit Heck at [facility]  2004   WISDOM TOOTH EXTRACTION      Family History  Problem Relation Age of Onset   Diabetes Father    Diverticulitis Father    Cancer Maternal Grandfather        bone, skin   Colon cancer Neg Hx    Colon polyps Neg Hx    Esophageal cancer Neg Hx    Stomach cancer Neg Hx    Rectal cancer Neg Hx     Social History   Socioeconomic History   Marital status: Married    Spouse name: Not on file   Number of children: Not on file   Years of education: Not on file   Highest education level: Not on file  Occupational History   Occupation: Media planner  tech  Tobacco Use   Smoking status: Never   Smokeless tobacco: Never  Vaping Use   Vaping Use: Never used  Substance and Sexual Activity   Alcohol use: No   Drug use: No   Sexual activity: Yes    Partners: Female  Other Topics Concern   Not on file  Social History Narrative   2 boys- 2001 and 1999   Married   Enjoys Designer, fashion/clothing   Completed 4 yrs of Media planner schooling   Works as Facilities manager   Social Determinants of Radio broadcast assistant Strain: Not on Art therapist Insecurity: Not on file  Transportation Needs: Not on file  Physical Activity: Not on file  Stress: Not on file  Social Connections: Not on file  Intimate Partner Violence: Not on file    Outpatient Medications Prior to Visit  Medication Sig Dispense Refill   cyclobenzaprine (FLEXERIL) 10 MG  tablet Take 10 mg by mouth 3 (three) times daily as needed for muscle spasms.     omeprazole (PRILOSEC) 20 MG capsule TAKE 1 CAPSULE DAILY 90 capsule 1   tadalafil (CIALIS) 20 MG tablet Take 1/2 - 1 tablet (10-20 mg total) by mouth every other day as needed for erectile dysfunction. 15 tablet 10   traMADol (ULTRAM) 50 MG tablet Take 50 mg by mouth every 6 (six) hours as needed.     EPINEPHrine 0.3 mg/0.3 mL IJ SOAJ injection Use as needed for anaphylaxis, allergic reactions     No facility-administered medications prior to visit.    Allergies  Allergen Reactions   Phenylephrine Hcl (Pressors)     Other reaction(s): rash   Sudafed Pe [Phenylephrine] Other (See Comments)    Rash on palms    Review of Systems  Constitutional:  Negative for fever.  HENT:  Negative for congestion, sinus pain and sore throat.   Respiratory:  Negative for cough, shortness of breath and wheezing.   Cardiovascular:  Negative for palpitations.  Gastrointestinal:  Negative for blood in stool, constipation, diarrhea, nausea and vomiting.  Genitourinary:  Negative for dysuria, frequency and hematuria.  Musculoskeletal:  Negative for joint pain and myalgias.  Skin:  Negative for rash.       (-) New Moles  Neurological:  Negative for headaches.  Psychiatric/Behavioral:  Negative for depression. The patient is not nervous/anxious.        Objective:    Physical Exam Constitutional:      General: He is not in acute distress.    Appearance: Normal appearance. He is not ill-appearing.  HENT:     Head: Normocephalic and atraumatic.     Right Ear: Tympanic membrane, ear canal and external ear normal.     Left Ear: Tympanic membrane, ear canal and external ear normal.     Mouth/Throat:     Pharynx: No oropharyngeal exudate.  Eyes:     Extraocular Movements: Extraocular movements intact.     Pupils: Pupils are equal, round, and reactive to light.  Neck:     Thyroid: No thyromegaly.  Cardiovascular:     Rate  and Rhythm: Normal rate and regular rhythm.     Heart sounds: Normal heart sounds. No murmur heard.    No gallop.  Pulmonary:     Effort: Pulmonary effort is normal. No respiratory distress.     Breath sounds: Normal breath sounds. No wheezing or rales.  Abdominal:     General: Bowel sounds are normal. There is no distension.  Palpations: Abdomen is soft.     Tenderness: There is no abdominal tenderness. There is no guarding.  Musculoskeletal:     Comments: 5/5 strength in both upper and lower extremities    Lymphadenopathy:     Cervical: No cervical adenopathy.  Skin:    General: Skin is warm and dry.  Neurological:     Mental Status: He is alert and oriented to person, place, and time.     Deep Tendon Reflexes:     Reflex Scores:      Patellar reflexes are 2+ on the right side and 2+ on the left side. Psychiatric:        Mood and Affect: Mood normal.        Behavior: Behavior normal.        Judgment: Judgment normal.     BP 117/76 (BP Location: Right Arm, Patient Position: Sitting, Cuff Size: Large)   Pulse 69   Temp 98.1 F (36.7 C) (Oral)   Resp 16   Ht 6' 1.5" (1.867 m)   Wt 239 lb (108.4 kg)   SpO2 98%   BMI 31.10 kg/m  Wt Readings from Last 3 Encounters:  08/17/22 239 lb (108.4 kg)  01/25/22 241 lb (109.3 kg)  10/06/21 243 lb (110.2 kg)       Assessment & Plan:   Problem List Items Addressed This Visit       Unprioritized   Routine general medical examination at a health care facility - Primary    Wt Readings from Last 3 Encounters:  08/17/22 239 lb (108.4 kg)  01/25/22 241 lb (109.3 kg)  10/06/21 243 lb (110.2 kg)  Started yoga, continues to work on [employer], exercise, weight loss. PSA and colo up to date. Encouraged pt to get covid vaccine at the pharmacy.  He will consider shingrix but wishes to do more research first.       Relevant Orders   CBC with Differential/Platelet   Comp Met (CMET)   TSH   Lipid panel   History of angioedema     No recurrences. Update epipen.       ED (erectile dysfunction)    Good response to Cialis. Continue 10-39m prn.       Cervical pain (neck)    Improved with yoga. Monitor.       Meds ordered this encounter  Medications   EPINEPHrine 0.3 mg/0.3 mL IJ SOAJ injection    Sig: Inject 0.3 mg into the muscle as needed for anaphylaxis. Use as needed for anaphylaxis, allergic reactions    Dispense:  2 each    Refill:  1    Order Specific Question:   Supervising Provider    Answer:   BPenni HomansA [[id number]]    I, MNance Pear NP, personally preformed the services described in this documentation.  All medical record entries made by the scribe were at my direction and in my presence.  I have reviewed the chart and discharge instructions (if applicable) and agree that the record reflects my personal performance and is accurate and complete. 08/17/2022   I,Amber Collins,acting as a scribe for MNance Pear NP.,have documented all relevant documentation on the behalf of MNance Pear NP,as directed by  MNance Pear NP while in the presence of MNance Pear NP.    MNance Pear NP

## 2022-08-17 NOTE — Assessment & Plan Note (Signed)
Improved with yoga. Monitor.

## 2022-08-25 ENCOUNTER — Other Ambulatory Visit (HOSPITAL_BASED_OUTPATIENT_CLINIC_OR_DEPARTMENT_OTHER): Payer: Self-pay

## 2022-09-07 DIAGNOSIS — G4733 Obstructive sleep apnea (adult) (pediatric): Secondary | ICD-10-CM | POA: Diagnosis not present

## 2022-10-08 DIAGNOSIS — G4733 Obstructive sleep apnea (adult) (pediatric): Secondary | ICD-10-CM | POA: Diagnosis not present

## 2022-10-22 ENCOUNTER — Other Ambulatory Visit (HOSPITAL_BASED_OUTPATIENT_CLINIC_OR_DEPARTMENT_OTHER): Payer: Self-pay

## 2022-11-11 DIAGNOSIS — G4733 Obstructive sleep apnea (adult) (pediatric): Secondary | ICD-10-CM | POA: Diagnosis not present

## 2022-12-10 DIAGNOSIS — G4733 Obstructive sleep apnea (adult) (pediatric): Secondary | ICD-10-CM | POA: Diagnosis not present

## 2022-12-15 DIAGNOSIS — L821 Other seborrheic keratosis: Secondary | ICD-10-CM | POA: Diagnosis not present

## 2022-12-15 DIAGNOSIS — L918 Other hypertrophic disorders of the skin: Secondary | ICD-10-CM | POA: Diagnosis not present

## 2022-12-15 DIAGNOSIS — L570 Actinic keratosis: Secondary | ICD-10-CM | POA: Diagnosis not present

## 2023-01-10 DIAGNOSIS — G4733 Obstructive sleep apnea (adult) (pediatric): Secondary | ICD-10-CM | POA: Diagnosis not present

## 2023-01-17 ENCOUNTER — Other Ambulatory Visit: Payer: Self-pay

## 2023-01-17 ENCOUNTER — Encounter: Payer: Self-pay | Admitting: Family

## 2023-01-17 MED ORDER — OMEPRAZOLE 20 MG PO CPDR: 20.0000 mg | DELAYED_RELEASE_CAPSULE | Freq: Every day | ORAL | 1 refills | Status: AC

## 2023-01-31 ENCOUNTER — Other Ambulatory Visit (HOSPITAL_BASED_OUTPATIENT_CLINIC_OR_DEPARTMENT_OTHER): Payer: Self-pay

## 2023-01-31 NOTE — Progress Notes (Signed)
Subjective:   By signing my name below, I, James Burton, attest that this documentation has been prepared under the direction and in the presence of Lemont Fillers, NP 02/03/23   Patient ID: James Burton, male    DOB: May 09, 1971, 52 y.o.   MRN: [id number]  Chief Complaint  Patient presents with   Knee Pain    Complains of bilateral knee pain, more on the left    HPI Patient is in today for an office visit.   Knee pain:  He complains of bilateral knee pain, stating it is worse on the left. About a month ago his chair broke while at a restaurant and his knees hit the top of the table. He states last week the pain worsened. He denies any tenderness to touch or new swelling.   Left Arm:  He reports his left arm has not been able to fully extend. He has not had any limitations at work due to this.   Past Medical History:  Diagnosis Date   Allergy    seasonal allergies   Bilateral inguinal hernia    Cervical herniated disc    GERD (gastroesophageal reflux disease)    on meds   OSA (obstructive sleep apnea) 09/25/2014   Moderate OSA per home study 3/16    Plantar fasciitis    hx of   Sleep apnea    use CPAP    Past Surgical History:  Procedure Laterality Date   COLONOSCOPY  02/06/2021   Doristine Locks at [facility]   VASECTOMY  2004   WISDOM TOOTH EXTRACTION      Family History  Problem Relation Age of Onset   Diabetes Father    Diverticulitis Father    Cancer Maternal Grandfather        bone, skin   Colon cancer Neg Hx    Colon polyps Neg Hx    Esophageal cancer Neg Hx    Stomach cancer Neg Hx    Rectal cancer Neg Hx     Social History   Socioeconomic History   Marital status: Married    Spouse name: Not on file   Number of children: Not on file   Years of education: Not on file   Highest education level: Not on file  Occupational History   Occupation: Curator  Tobacco Use   Smoking status: Never   Smokeless tobacco: Never  Vaping Use    Vaping Use: Never used  Substance and Sexual Activity   Alcohol use: No   Drug use: No   Sexual activity: Yes    Partners: Female  Other Topics Concern   Not on file  Social History Narrative   2 boys- 2001 and 1999   Married   Enjoys Photographer   Completed 4 yrs of Engineer, structural schooling   Works as Civil engineer, contracting   Social Determinants of Corporate investment banker Strain: Not on Ship broker Insecurity: Not on file  Transportation Needs: Not on file  Physical Activity: Not on file  Stress: Not on file  Social Connections: Not on file  Intimate Partner Violence: Not on file    Outpatient Medications Prior to Visit  Medication Sig Dispense Refill   cyclobenzaprine (FLEXERIL) 10 MG tablet Take 10 mg by mouth 3 (three) times daily as needed for muscle spasms.     EPINEPHrine 0.3 mg/0.3 mL IJ SOAJ injection Inject 0.3 mg into the muscle as needed for anaphylaxis. Use as needed for anaphylaxis, allergic reactions  2 each 1   omeprazole (PRILOSEC) 20 MG capsule Take 1 capsule (20 mg total) by mouth daily. 90 capsule 1   tadalafil (CIALIS) 20 MG tablet Take 1/2 - 1 tablet (10-20 mg total) by mouth every other day as needed for erectile dysfunction. 15 tablet 10   traMADol (ULTRAM) 50 MG tablet Take 50 mg by mouth every 6 (six) hours as needed.     No facility-administered medications prior to visit.    Allergies  Allergen Reactions   Phenylephrine Hcl (Pressors)     Other reaction(s): rash   Sudafed Pe [Phenylephrine] Other (See Comments)    Rash on palms    Review of Systems  Musculoskeletal:  Positive for joint pain (bilateral knee pain, worse in left).       Objective:    Physical Exam Constitutional:      Appearance: Normal appearance.  Eyes:     Extraocular Movements: Extraocular movements intact.  Musculoskeletal:     Comments: No knee swelling.  Some tenderness above the left knee cap.  Left elbow without swelling, unable to fully extend his left elbow   Skin:    General: Skin is warm and dry.  Neurological:     Mental Status: He is alert.  Psychiatric:        Mood and Affect: Mood normal.        Behavior: Behavior normal.        Thought Content: Thought content normal.        Judgment: Judgment normal.     BP 119/65 (BP Location: Right Arm, Patient Position: Sitting, Cuff Size: Large)   Pulse 67   Temp 97.6 F (36.4 C) (Oral)   Resp 16   Wt 237 lb (107.5 kg)   SpO2 97%   BMI 30.84 kg/m  Wt Readings from Last 3 Encounters:  02/01/23 237 lb (107.5 kg)  08/17/22 239 lb (108.4 kg)  01/25/22 241 lb (109.3 kg)       Assessment & Plan:  Elbow disorder Assessment & Plan: Obtain x-ray for further evaluation.  Consider referral to sports medicine.   Orders: -     DG Elbow 2 Views Left; Future  Left knee pain, unspecified chronicity Assessment & Plan: New.  Trial of meloxicam. If symptoms fail to improve, plan referral to sports med.  Orders: -     DG KNEE 3 VIEW LEFT; Future  Other orders -     Meloxicam; Take 1 tablet (7.5 mg total) by mouth daily.  Dispense: 14 tablet; Refill: 0     I,James Burton,acting as a scribe for Lemont Fillers, NP.,have documented all relevant documentation on the behalf of Lemont Fillers, NP,as directed by  Lemont Fillers, NP while in the presence of Lemont Fillers, NP.   I, Lemont Fillers, NP, personally preformed the services described in this documentation.  All medical record entries made by the scribe were at my direction and in my presence.  I have reviewed the chart and discharge instructions (if applicable) and agree that the record reflects my personal performance and is accurate and complete. 02/03/23   Lemont Fillers, NP

## 2023-02-01 ENCOUNTER — Ambulatory Visit (INDEPENDENT_AMBULATORY_CARE_PROVIDER_SITE_OTHER): Payer: BC Managed Care – PPO | Admitting: Family

## 2023-02-01 ENCOUNTER — Ambulatory Visit (HOSPITAL_BASED_OUTPATIENT_CLINIC_OR_DEPARTMENT_OTHER)
Admission: RE | Admit: 2023-02-01 | Discharge: 2023-02-01 | Disposition: A | Discharge status: Home / Self Care | Payer: BC Managed Care – PPO | Source: Ambulatory Visit | Attending: Family | Admitting: Family

## 2023-02-01 VITALS — BP 119/65 | HR 67 | Temp 97.6°F | Resp 16 | Wt 237.0 lb

## 2023-02-01 DIAGNOSIS — M25522 Pain in left elbow: Secondary | ICD-10-CM | POA: Diagnosis not present

## 2023-02-01 DIAGNOSIS — M25562 Pain in left knee: Secondary | ICD-10-CM | POA: Diagnosis not present

## 2023-02-01 DIAGNOSIS — M259 Joint disorder, unspecified: Secondary | ICD-10-CM

## 2023-02-01 DIAGNOSIS — M19022 Primary osteoarthritis, left elbow: Secondary | ICD-10-CM | POA: Diagnosis not present

## 2023-02-01 MED ORDER — MELOXICAM 7.5 MG PO TABS: 7.5000 mg | ORAL_TABLET | Freq: Every day | ORAL | 0 refills | Status: DC

## 2023-02-03 DIAGNOSIS — M25522 Pain in left elbow: Secondary | ICD-10-CM | POA: Insufficient documentation

## 2023-02-03 DIAGNOSIS — M259 Joint disorder, unspecified: Secondary | ICD-10-CM | POA: Insufficient documentation

## 2023-02-03 DIAGNOSIS — M25562 Pain in left knee: Secondary | ICD-10-CM | POA: Insufficient documentation

## 2023-02-03 NOTE — Assessment & Plan Note (Signed)
New.  Trial of meloxicam. If symptoms fail to improve, plan referral to sports med.

## 2023-02-03 NOTE — Assessment & Plan Note (Signed)
>>  ASSESSMENT AND PLAN FOR ELBOW DISORDER WRITTEN ON 02/03/2023 12:33 PM BY O'SULLIVAN, Yuriy Cui, NP  Obtain x-ray for further evaluation.  Consider referral to sports medicine.

## 2023-02-03 NOTE — Assessment & Plan Note (Signed)
Obtain x-ray for further evaluation.  Consider referral to sports medicine.

## 2023-02-08 ENCOUNTER — Telehealth: Payer: Self-pay | Admitting: Family

## 2023-02-08 DIAGNOSIS — M898X2 Other specified disorders of bone, upper arm: Secondary | ICD-10-CM

## 2023-02-08 DIAGNOSIS — M25562 Pain in left knee: Secondary | ICD-10-CM

## 2023-02-08 NOTE — Telephone Encounter (Signed)
See mychart.  

## 2023-02-11 ENCOUNTER — Ambulatory Visit (INDEPENDENT_AMBULATORY_CARE_PROVIDER_SITE_OTHER): Payer: BC Managed Care – PPO | Admitting: Orthopaedic Surgery

## 2023-02-11 DIAGNOSIS — M25522 Pain in left elbow: Secondary | ICD-10-CM

## 2023-02-11 DIAGNOSIS — M25562 Pain in left knee: Secondary | ICD-10-CM

## 2023-02-11 DIAGNOSIS — G4733 Obstructive sleep apnea (adult) (pediatric): Secondary | ICD-10-CM | POA: Diagnosis not present

## 2023-02-11 DIAGNOSIS — G8929 Other chronic pain: Secondary | ICD-10-CM

## 2023-02-11 NOTE — Progress Notes (Signed)
Office Visit Note   Patient: James Burton           Date of Birth: [birth year]           MRN: [id number] Visit Date: 02/11/2023              Requested by: Sandford Craze, NP [address] [place],  [place] [address] PCP: Sandford Craze, NP   Assessment & Plan: Visit Diagnoses:  1. Chronic pain of left knee   2. 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  He does have findings on x-ray consistent with a ligament of Struthers.  He does not have any symptoms of median nerve compression.  His lack of range of motion is due to the osteoarthritis.  He will continue to treat with NSAIDs and activity modification as needed.  For the knee he aggravated the superior traction spur.  Possibly a nondisplaced fracture.  He has full function.  Disease process explained in detail.  I recommended using some Voltaren gel and a counterforce strap.  Home exercises provided.  Will see him back as needed.  Follow-Up Instructions: No follow-ups on file.   Orders:  No orders of the defined types were placed in this encounter.  No orders of the defined types were placed in this encounter.     Procedures: No procedures performed   Clinical Data: No additional findings.   Subjective: Chief Complaint  Patient presents with   Left Elbow - Pain   Left Knee - Pain    HPI James Burton is a very pleasant 52 year old gentleman here for evaluation of left elbow stiffness and left knee pain.  For the left elbow his only symptoms are lack of full extension.  Denies any pain.  He services elevators.  He is right-hand dominant.  Denies any injuries.  Occasionally has some numbness and tingling to the ulnar digits.  For the left knee he had an injury in which his knee bumped against a table in the last year and has had pain directly to the superior pole of the patella.  Using stairs is painful.  Feels crepitus behind the  patella. Review of Systems  Constitutional: Negative.   HENT: Negative.    Eyes: Negative.   Respiratory: Negative.    Cardiovascular: Negative.   Gastrointestinal: Negative.   Endocrine: Negative.   Genitourinary: Negative.   Skin: Negative.   Allergic/Immunologic: Negative.   Neurological: Negative.   Hematological: Negative.   Psychiatric/Behavioral: Negative.    All other systems reviewed and are negative.    Objective: Vital Signs: There were no vitals taken for this visit.  Physical Exam Vitals and nursing note reviewed.  Constitutional:      Appearance: He is well-developed.  HENT:     Head: Normocephalic and atraumatic.  Eyes:     Pupils: Pupils are equal, round, and reactive to light.  Pulmonary:     Effort: Pulmonary effort is normal.  Abdominal:     Palpations: Abdomen is soft.  Musculoskeletal:        General: Normal range of motion.     Cervical back: Neck supple.  Skin:    General: Skin is warm.  Neurological:     Mental Status: He is alert and oriented to person, place, and time.  Psychiatric:        Behavior: Behavior normal.        Thought  Content: Thought content normal.        Judgment: Judgment normal.     Ortho Exam Examination left elbow shows very mild restriction in elbow extension and flexion.  He has full pronation and supination.  Normal strength.  No bony tenderness.  Examination of left knee shows no joint effusion.  He has excellent range of motion.  Collaterals and cruciates are stable.  He has some tenderness to the superior pole of the patella. Specialty Comments:  No specialty comments available.  Imaging: No results found.   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   Family history of  glycogen storage disease 01/25/2022   Hemorrhoids 10/07/2021   Plantar fasciitis 01/29/2016   Cervical pain (neck) 03/07/2015   OSA (obstructive sleep apnea) 09/25/2014   Routine general medical examination at a health care facility 03/19/2014   Past Medical History:  Diagnosis Date   Allergy    seasonal allergies   Bilateral inguinal hernia    Cervical herniated disc    GERD (gastroesophageal reflux disease)    on meds   OSA (obstructive sleep apnea) 09/25/2014   Moderate OSA per home study 3/16    Plantar fasciitis    hx of   Sleep apnea    use CPAP    Family History  Problem Relation Age of Onset   Diabetes Father    Diverticulitis Father    Cancer Maternal Grandfather        bone, skin   Colon cancer Neg Hx    Colon polyps Neg Hx    Esophageal cancer Neg Hx    Stomach cancer Neg Hx    Rectal cancer Neg Hx     Past Surgical History:  Procedure Laterality Date   COLONOSCOPY  02/06/2021   Doristine Locks at [facility]   VASECTOMY  2004   WISDOM TOOTH EXTRACTION     Social History   Occupational History   Occupation: Curator  Tobacco Use   Smoking status: Never   Smokeless tobacco: Never  Vaping Use   Vaping Use: Never used  Substance and Sexual Activity   Alcohol use: No   Drug use: No   Sexual activity: Yes    Partners: Female

## 2023-03-13 DIAGNOSIS — G4733 Obstructive sleep apnea (adult) (pediatric): Secondary | ICD-10-CM | POA: Diagnosis not present

## 2023-04-13 DIAGNOSIS — G4733 Obstructive sleep apnea (adult) (pediatric): Secondary | ICD-10-CM | POA: Diagnosis not present

## 2023-05-17 IMAGING — XA DG INJECT/[PERSON_NAME] INC NEEDLE/CATH/PLC EPI/CERV/THOR W/IMG
2 series · 2 of 2 positions shown · non-contrast
Comparison: none

CLINICAL DATA: Cervical spondylosis without myelopathy. Positive
response to multiple prior epidural injections. Recurrent neck and
primarily left-sided shoulder and arm pain.

[Series 1: ortho standard · 1 of 1 slices shown (1 of 2)]
[im 1/1]
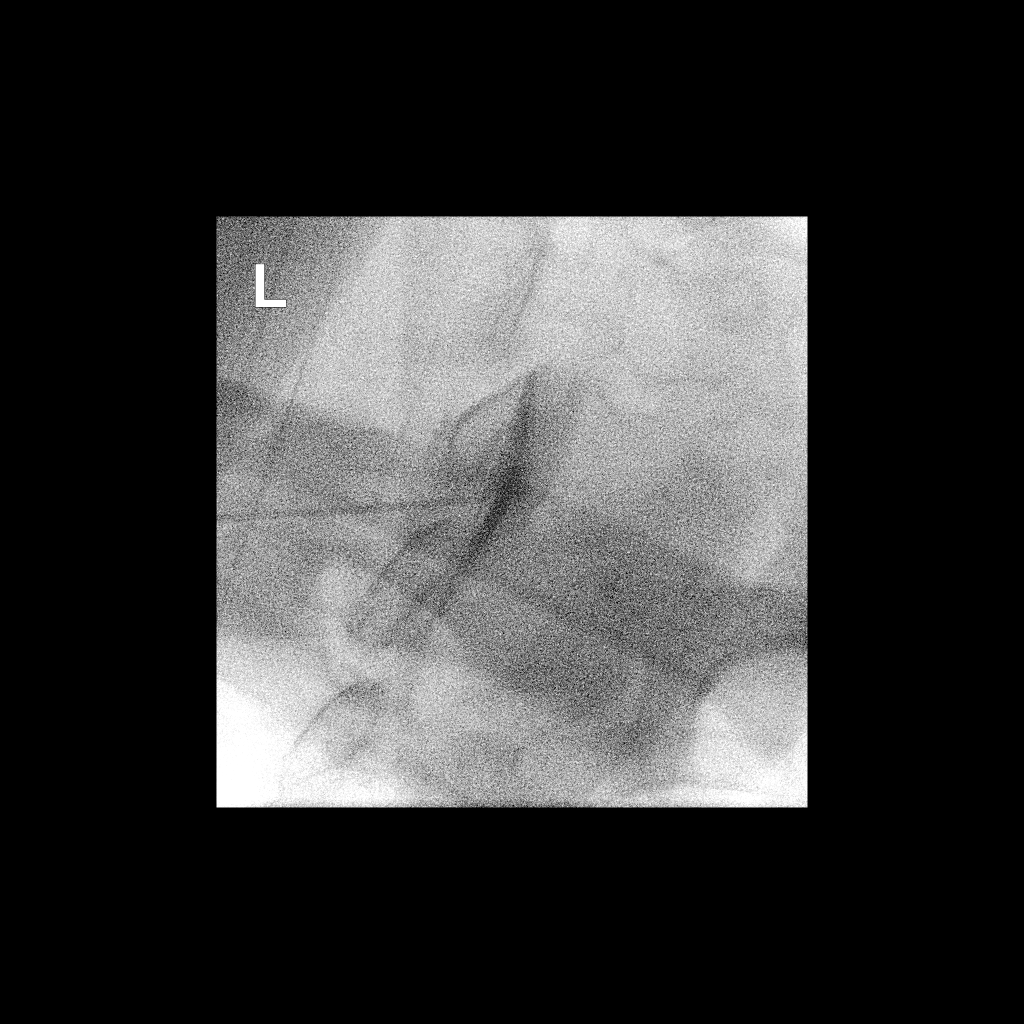

[Series 2: ortho standard · 1 of 1 slices shown (2 of 2)]
[im 1/1]
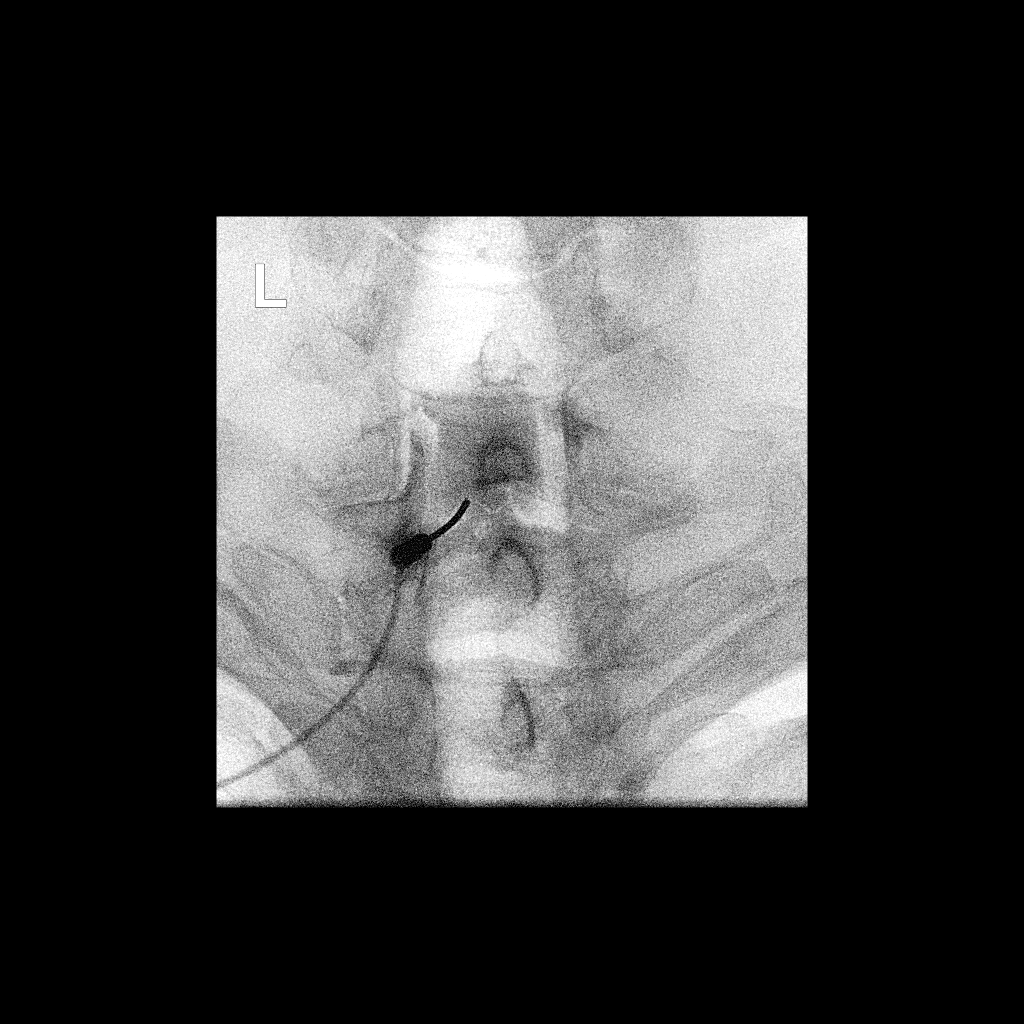

[2 of 2 positions shown; findings below may reference images not displayed]

FLUOROSCOPY TIME:  Fluoroscopy Time: 27 seconds

Radiation Exposure Index: 18.64 microGray*m^2

PROCEDURE:
The procedure, risks, benefits, and alternatives were explained to
the patient. Questions regarding the procedure were encouraged and
answered. The patient understands and consents to the procedure.

CERVICAL EPIDURAL INJECTION

An interlaminar approach was performed on the left at C7-T1. A
inch 20 gauge epidural needle was advanced using loss-of-resistance
technique.

DIAGNOSTIC EPIDURAL INJECTION

Injection of Isovue-M 300 shows a good epidural pattern with spread
above and below the level of needle placement, primarily on the
left. No vascular opacification is seen.

THERAPEUTIC EPIDURAL INJECTION

1.5 ml of Kenalog 40 mixed with 2 ml of normal saline were then
instilled. The procedure was well-tolerated, and the patient was
discharged thirty minutes following the injection in good condition.
IMPRESSION: Technically successful interlaminar epidural injection on the left
at C7-T1.

## 2023-07-31 ENCOUNTER — Other Ambulatory Visit: Payer: Self-pay | Admitting: Family

## 2023-08-01 ENCOUNTER — Other Ambulatory Visit (HOSPITAL_BASED_OUTPATIENT_CLINIC_OR_DEPARTMENT_OTHER): Payer: Self-pay

## 2023-08-01 MED ORDER — TADALAFIL 20 MG PO TABS
10.0000 mg | ORAL_TABLET | ORAL | 10 refills | Status: DC | PRN
  Filled 2023-08-01: qty 15, 30d supply, fill #0
  Filled 2023-11-28: qty 15, 30d supply, fill #1

## 2023-08-23 ENCOUNTER — Encounter: Payer: Self-pay | Admitting: Family

## 2023-08-23 ENCOUNTER — Ambulatory Visit (INDEPENDENT_AMBULATORY_CARE_PROVIDER_SITE_OTHER): Payer: BC Managed Care – PPO | Admitting: Family

## 2023-08-23 VITALS — BP 123/78 | HR 77 | Temp 98.7°F | Resp 16 | Ht 73.5 in | Wt 241.0 lb

## 2023-08-23 DIAGNOSIS — E781 Pure hyperglyceridemia: Secondary | ICD-10-CM | POA: Diagnosis not present

## 2023-08-23 DIAGNOSIS — Z0000 Encounter for general adult medical examination without abnormal findings: Secondary | ICD-10-CM | POA: Diagnosis not present

## 2023-08-23 DIAGNOSIS — Z125 Encounter for screening for malignant neoplasm of prostate: Secondary | ICD-10-CM

## 2023-08-23 DIAGNOSIS — K219 Gastro-esophageal reflux disease without esophagitis: Secondary | ICD-10-CM | POA: Insufficient documentation

## 2023-08-23 DIAGNOSIS — R5383 Other fatigue: Secondary | ICD-10-CM | POA: Diagnosis not present

## 2023-08-23 DIAGNOSIS — H9190 Unspecified hearing loss, unspecified ear: Secondary | ICD-10-CM | POA: Diagnosis not present

## 2023-08-23 DIAGNOSIS — F4321 Adjustment disorder with depressed mood: Secondary | ICD-10-CM | POA: Insufficient documentation

## 2023-08-23 DIAGNOSIS — N529 Male erectile dysfunction, unspecified: Secondary | ICD-10-CM

## 2023-08-23 LAB — COMPREHENSIVE METABOLIC PANEL
ALT: 31 U/L (ref 0–53)
AST: 20 U/L (ref 0–37)
Albumin: 4.8 g/dL (ref 3.5–5.2)
Alkaline Phosphatase: 97 U/L (ref 39–117)
BUN: 25 mg/dL — ABNORMAL HIGH (ref 6–23)
CO2: 29 meq/L (ref 19–32)
Calcium: 9.5 mg/dL (ref 8.4–10.5)
Chloride: 105 meq/L (ref 96–112)
Creatinine, Ser: 1.19 mg/dL (ref 0.40–1.50)
GFR: 70.17 mL/min (ref >60.00)
Glucose, Bld: 97 mg/dL (ref 70–99)
Potassium: 5 meq/L (ref 3.5–5.1)
Sodium: 141 meq/L (ref 135–145)
Total Bilirubin: 0.8 mg/dL (ref 0.2–1.2)
Total Protein: 7 g/dL (ref 6.0–8.3)

## 2023-08-23 LAB — CBC WITH DIFFERENTIAL/PLATELET
Basophils Absolute: 0.1 10*3/uL (ref 0.0–0.1)
Basophils Relative: 1 % (ref 0.0–3.0)
Eosinophils Absolute: 0.2 10*3/uL (ref 0.0–0.7)
Eosinophils Relative: 3.2 % (ref 0.0–5.0)
HCT: 47.2 % (ref 39.0–52.0)
Hemoglobin: 15.9 g/dL (ref 13.0–17.0)
Lymphocytes Relative: 20.1 % (ref 12.0–46.0)
Lymphs Abs: 1.1 10*3/uL (ref 0.7–4.0)
MCHC: 33.6 g/dL (ref 30.0–36.0)
MCV: 87.7 fL (ref 78.0–100.0)
Monocytes Absolute: 0.5 10*3/uL (ref 0.1–1.0)
Monocytes Relative: 8.2 % (ref 3.0–12.0)
Neutro Abs: 3.8 10*3/uL (ref 1.4–7.7)
Neutrophils Relative %: 67.5 % (ref 43.0–77.0)
Platelets: 191 10*3/uL (ref 150.0–400.0)
RBC: 5.39 Mil/uL (ref 4.22–5.81)
RDW: 12.6 % (ref 11.5–15.5)
WBC: 5.6 10*3/uL (ref 4.0–10.5)

## 2023-08-23 LAB — LIPID PANEL
Cholesterol: 192 mg/dL (ref 0–200)
HDL: 28.2 mg/dL — ABNORMAL LOW (ref >39.00)
LDL Cholesterol: 137 mg/dL — ABNORMAL HIGH (ref 0–99)
NonHDL: 164.29
Total CHOL/HDL Ratio: 7
Triglycerides: 134 mg/dL (ref 0.0–149.0)
VLDL: 26.8 mg/dL (ref 0.0–40.0)

## 2023-08-23 LAB — TSH: TSH: 1.67 u[IU]/mL (ref 0.35–5.50)

## 2023-08-23 LAB — PSA: PSA: 1.46 ng/mL (ref 0.10–4.00)

## 2023-08-23 NOTE — Assessment & Plan Note (Signed)
  Patient is experiencing feelings of grief following the death of his father. Discussed the normalcy of these feelings and the potential benefits of grief counseling. -Offered referral to grief counseling if feelings persist or worsen. -I don't see indication for medication therapy at this time.

## 2023-08-23 NOTE — Assessment & Plan Note (Addendum)
-  Encouraged to consider COVID booster and shingles vaccine. -Advised to continue regular dental and vision check-ups. -Pt interested in cardiac CT for assessment of coronary artery calcium buildup.  Patient is making efforts to improve diet and increase physical activity. Discussed the importance of maintaining a healthy diet and regular exercise. -Encouraged to continue efforts in diet improvement and regular exercise.

## 2023-08-23 NOTE — Patient Instructions (Signed)
VISIT SUMMARY:  During your visit, we discussed your concerns about energy levels, sex drive, and occasional acid reflux. We also reviewed your weight management efforts, a skin lesion, and feelings of grief following your father's passing. Additionally, we talked about your urinary symptoms, elevated triglycerides, and potential low testosterone levels. We covered general health maintenance, including vaccines and routine check-ups.  YOUR PLAN:  -OVERWEIGHT: Being overweight can affect your overall health. You are making good progress with diet and exercise. Continue these efforts to maintain a healthy weight.  -GRIEF: Grief is a natural response to loss. You are experiencing grief after your father's death. If these feelings persist or worsen, consider grief counseling.  -GASTROESOPHAGEAL REFLUX DISEASE (GERD): GERD is a condition where stomach acid frequently flows back into the esophagus. Continue taking Omeprazole and make dietary changes to manage your symptoms.  -URINARY SYMPTOMS: You have occasional dribbling after urination, which could be a sign of an enlarged prostate. We will monitor this for any changes.  -HYPERTRIGLYCERIDEMIA: Hypertriglyceridemia means you have high levels of triglycerides in your blood. Continue with dietary changes, and we will check your cholesterol, kidney function, liver function, and PSA levels.  -LOW TESTOSTERONE: Low testosterone can cause low energy and decreased sex drive. We will check your testosterone levels and consider further treatment if needed.  -GENERAL HEALTH MAINTENANCE: Consider getting a COVID booster and shingles vaccine. Continue regular dental and vision check-ups. A cardiac CT may help assess your heart health. Continue taking Cialis as needed for erectile dysfunction.  INSTRUCTIONS:  Please follow up in 6 months, or sooner if any concerns arise. We will also order labs to check your cholesterol, kidney function, liver function, PSA,  and testosterone levels.

## 2023-08-23 NOTE — Assessment & Plan Note (Signed)
  Patient reports concerns about low energy levels and decreased sex drive. Discussed the potential benefits and drawbacks of testosterone replacement therapy. -Order labs to check testosterone levels. If low, consider referral to urology for further discussion on testosterone replacement therapy.

## 2023-08-23 NOTE — Assessment & Plan Note (Signed)
Continue cialis prn.  

## 2023-08-23 NOTE — Assessment & Plan Note (Signed)
Was told after initial evaluation to follow up in 6 months. Will arrange.

## 2023-08-23 NOTE — Assessment & Plan Note (Signed)
Stable with PRN omeprazole. Continue same. Reinforce GERD diet.

## 2023-08-23 NOTE — Assessment & Plan Note (Signed)
Discussed dietary changes to help trigs.

## 2023-08-23 NOTE — Progress Notes (Signed)
Subjective:     Patient ID: James Burton, male    DOB: [birth year], 52 y.o.   MRN: [id number]  Chief Complaint  Patient presents with   Annual Exam    HPI  Discussed the use of AI scribe software for clinical note transcription with the patient, who gave verbal consent to proceed.  History of Present Illness   The patient, with a history of weight management issues and potential low testosterone, presents for a routine physical check-up. He expresses concerns about his energy levels and a decrease in sex drive, wondering if these could be related to low testosterone levels. He mentions occasional acid reflux, which seems to be triggered by certain food choices and late-night eating. He also points out a skin spot that has changed in appearance, which he plans to have checked by a dermatologist. The patient has been managing his weight through diet modifications and is considering further steps to improve his health, including a cardiac CT. He has a family history of heart issues and cancer, which adds to his motivation to stay on top of his health. The patient is currently taking Cialis as needed for erectile dysfunction and has previously taken Flexeril for muscle spasms, but not recently.      Immunizations: declines shingrix,  Diet: improving Wt Readings from Last 3 Encounters:  08/23/23 241 lb (109.3 kg)  02/01/23 237 lb (107.5 kg)  08/17/22 239 lb (108.4 kg)  Exercise:  active at work, 13086 steps a day Colonoscopy:  2022 due 2029 Vision: due Dental: up to date   Health Maintenance Due  Topic Date Due   COVID-19 Vaccine (3 - 2023-24 season) 05/15/2023    Past Medical History:  Diagnosis Date   Allergy    seasonal allergies   Bilateral inguinal hernia    Cervical herniated disc    GERD (gastroesophageal reflux disease)    on meds   OSA (obstructive sleep apnea) 09/25/2014   Moderate OSA per home study 3/16    Plantar fasciitis    hx of   Sleep apnea    use CPAP     Past Surgical History:  Procedure Laterality Date   COLONOSCOPY  02/06/2021   [facility] Burton at [facility]   VASECTOMY  2004   WISDOM TOOTH EXTRACTION      Family History  Problem Relation Age of Onset   Diabetes Father    Diverticulitis Father    Cancer Father        liver   Obesity Sister    Heart attack Maternal Grandmother 60   Cancer Maternal Grandfather        bone, skin   Diabetes type II Paternal Grandmother        died 3   Heart disease Paternal Grandfather    Melanoma Paternal Grandfather    Brain cancer Paternal Grandfather    Colon cancer Neg Hx    Colon polyps Neg Hx    Esophageal cancer Neg Hx    Stomach cancer Neg Hx    Rectal cancer Neg Hx     Social History   Socioeconomic History   Marital status: Married    Spouse name: Not on file   Number of children: Not on file   Years of education: Not on file   Highest education level: Not on file  Occupational History   Occupation: Curator  Tobacco Use   Smoking status: Never   Smokeless tobacco: Never  Vaping Use   Vaping status: Never  Used  Substance and Sexual Activity   Alcohol use: No   Drug use: No   Sexual activity: Yes    Partners: Female  Other Topics Concern   Not on file  Social History Narrative   2 boys- 2001 and 1999   Married   Enjoys Photographer   Completed 4 yrs of Engineer, structural schooling   Works as Civil engineer, contracting   Social Determinants of Corporate investment banker Strain: Not on Ship broker Insecurity: Not on file  Transportation Needs: Not on file  Physical Activity: Not on file  Stress: Not on file  Social Connections: Not on file  Intimate Partner Violence: Not on file    Outpatient Medications Prior to Visit  Medication Sig Dispense Refill   cyclobenzaprine (FLEXERIL) 10 MG tablet Take 10 mg by mouth 3 (three) times daily as needed for muscle spasms.     EPINEPHrine 0.3 mg/0.3 mL IJ SOAJ injection Inject 0.3 mg into the muscle as needed for anaphylaxis.  Use as needed for anaphylaxis, allergic reactions 2 each 1   meloxicam (MOBIC) 7.5 MG tablet Take 1 tablet (7.5 mg total) by mouth daily. 14 tablet 0   omeprazole (PRILOSEC) 20 MG capsule Take 1 capsule (20 mg total) by mouth daily. 90 capsule 1   tadalafil (CIALIS) 20 MG tablet Take 1/2 - 1 tablet (10-20 mg total) by mouth every other day as needed for erectile dysfunction. 15 tablet 10   traMADol (ULTRAM) 50 MG tablet Take 50 mg by mouth every 6 (six) hours as needed.     No facility-administered medications prior to visit.    Allergies  Allergen Reactions   Phenylephrine Hcl (Pressors)     Other reaction(s): rash   Sudafed Pe [Phenylephrine] Other (See Comments)    Rash on palms    Review of Systems  HENT:  Negative for congestion and hearing loss.   Eyes:  Negative for blurred vision.  Respiratory:  Negative for cough.   Cardiovascular:  Negative for leg swelling.  Gastrointestinal:  Positive for heartburn. Negative for constipation and diarrhea.  Genitourinary:  Negative for dysuria and frequency.  Musculoskeletal:  Negative for joint pain and myalgias.  Skin:  Negative for rash.  Neurological:  Negative for headaches.  Psychiatric/Behavioral:         See HPI       Objective:    Physical Exam Constitutional:      General: He is not in acute distress.    Appearance: He is well-developed.  HENT:     Head: Normocephalic and atraumatic.     Right Ear: Tympanic membrane and ear canal normal.     Left Ear: Tympanic membrane and ear canal normal.  Eyes:     Extraocular Movements: Extraocular movements intact.     Pupils: Pupils are equal, round, and reactive to light.  Cardiovascular:     Rate and Rhythm: Normal rate and regular rhythm.     Heart sounds: No murmur heard. Pulmonary:     Effort: Pulmonary effort is normal. No respiratory distress.     Breath sounds: Normal breath sounds. No wheezing or rales.  Musculoskeletal:        General: No swelling.     Cervical  back: Neck supple.  Lymphadenopathy:     Cervical: No cervical adenopathy.  Skin:    General: Skin is warm and dry.  Neurological:     Mental Status: He is alert and oriented to person, place, and time.  Deep Tendon Reflexes:     Reflex Scores:      Patellar reflexes are 2+ on the right side and 2+ on the left side. Psychiatric:        Behavior: Behavior normal.        Thought Content: Thought content normal.      BP 123/78 (BP Location: Right Arm, Patient Position: Sitting, Cuff Size: Large)   Pulse 77   Temp 98.7 F (37.1 C) (Oral)   Resp 16   Ht 6' 1.5" (1.867 m)   Wt 241 lb (109.3 kg)   SpO2 97%   BMI 31.36 kg/m  Wt Readings from Last 3 Encounters:  08/23/23 241 lb (109.3 kg)  02/01/23 237 lb (107.5 kg)  08/17/22 239 lb (108.4 kg)       Assessment & Plan:   Problem List Items Addressed This Visit       Unprioritized   Preventative health care     -Encouraged to consider COVID booster and shingles vaccine. -Advised to continue regular dental and vision check-ups. -Pt interested in cardiac CT for assessment of coronary artery calcium buildup.  Patient is making efforts to improve diet and increase physical activity. Discussed the importance of maintaining a healthy diet and regular exercise. -Encouraged to continue efforts in diet improvement and regular exercise.       Relevant Orders   CT CARDIAC SCORING (SELF PAY ONLY)   Hypertriglyceridemia    Discussed dietary changes to help trigs.       Relevant Orders   Lipid panel   Comp Met (CMET)   Hearing disorder - Primary    Was told after initial evaluation to follow up in 6 months. Will arrange.       Relevant Orders   Ambulatory referral to Audiology   Grief reaction     Patient is experiencing feelings of grief following the death of his father. Discussed the normalcy of these feelings and the potential benefits of grief counseling. -Offered referral to grief counseling if feelings persist or  worsen. -I don't see indication for medication therapy at this time.      Gastroesophageal reflux disease    Stable with PRN omeprazole. Continue same. Reinforce GERD diet.       Fatigue     Patient reports concerns about low energy levels and decreased sex drive. Discussed the potential benefits and drawbacks of testosterone replacement therapy. -Order labs to check testosterone levels. If low, consider referral to urology for further discussion on testosterone replacement therapy.      Relevant Orders   Testosterone Total,Free,Bio, Males-([facility])   TSH   CBC w/Diff   ED (erectile dysfunction)    Continue cialis prn.       Other Visit Diagnoses     Screening for prostate cancer       Relevant Orders   PSA       I have discontinued James Burton's traMADol. I am also having him maintain his cyclobenzaprine, EPINEPHrine, omeprazole, meloxicam, and tadalafil.  No orders of the defined types were placed in this encounter.

## 2023-08-24 ENCOUNTER — Telehealth (HOSPITAL_BASED_OUTPATIENT_CLINIC_OR_DEPARTMENT_OTHER): Payer: Self-pay | Admitting: Family

## 2023-08-24 LAB — TESTOSTERONE TOTAL,FREE,BIO, MALES
Albumin: 4.6 g/dL (ref 3.6–5.1)
Sex Hormone Binding: 25 nmol/L (ref 10–50)
Testosterone, Bioavailable: 121 ng/dL (ref 110.0–575.0)
Testosterone, Free: 57.6 pg/mL (ref 46.0–224.0)
Testosterone: 361 ng/dL (ref 250–827)

## 2023-08-26 ENCOUNTER — Ambulatory Visit (HOSPITAL_BASED_OUTPATIENT_CLINIC_OR_DEPARTMENT_OTHER)
Admission: RE | Admit: 2023-08-26 | Discharge: 2023-08-26 | Disposition: A | Discharge status: Home / Self Care | Payer: Self-pay | Source: Ambulatory Visit | Attending: Family | Admitting: Family

## 2023-08-26 DIAGNOSIS — Z0000 Encounter for general adult medical examination without abnormal findings: Secondary | ICD-10-CM | POA: Insufficient documentation

## 2023-10-04 ENCOUNTER — Telehealth: Payer: Self-pay | Admitting: Family

## 2023-10-04 ENCOUNTER — Ambulatory Visit: Payer: BC Managed Care – PPO | Attending: Family | Admitting: Audiologist

## 2023-10-04 DIAGNOSIS — H903 Sensorineural hearing loss, bilateral: Secondary | ICD-10-CM | POA: Diagnosis not present

## 2023-10-04 DIAGNOSIS — H9190 Unspecified hearing loss, unspecified ear: Secondary | ICD-10-CM

## 2023-10-04 NOTE — Telephone Encounter (Signed)
See mychart.  

## 2023-10-04 NOTE — Procedures (Signed)
  Outpatient Audiology and [facility] [address] [place], [place]  [address] [phone]  AUDIOLOGICAL  EVALUATION  NAME: James Burton     DOB:   07-Jun-1971      MRN: [id number]                                                                                     DATE: 10/04/2023     REFERENT: Sandford Craze, NP STATUS: Outpatient DIAGNOSIS: Sensorineural Hearing Loss Bilateral  History: [name] was seen for an audiological evaluation due to concern for decreased hearing. [name] had a hearing test a year ago and was told to monitor his hearing. He has no concerns for hearing loss.  [name] denies pain, pressure, or constant tinnitus. [name] has history of hazardous noise exposure from working around [employer].  Medical history shows no risk for hearing loss.   Results from previous testing found in care everywhere. [name] AuD reported audiogram, right ear with normal hearing across all frequencies and left ear with normal hearing from 250-500Hz  with a mild SNHL at 1k Hz, rising to normal from 2-8k Hz.   Evaluation:  Otoscopy showed a clear view of the tympanic membranes, bilaterally Tympanometry results were consistent with negative pressure in the left ear and hypercompliance in the right  Audiometric testing was completed using Conventional Audiometry techniques with insert earphones and supraural headphones. Test results are consistent with normal hearing exceot for 750Hz  in the right ear and normal except 750-1kHz in the left ear. Speech Recognition Thresholds were obtained at 20dB HL in the right ear and at 25dB HL in the left ear. Word Recognition Testing was completed at  40dB SL and [name] scored 100% in each ear.   Results:  The test results were reviewed with [name]. See audiogram below. [name] has had a progression in the loss described on his last evaluation. [name] should follow up with the Otolaryngologist again to see if there are any medical  concerns.  Audiogram printed and provided to [name].      Recommendations: Annual audiometric testing recommended to monitor hearing loss for progression.  Recommend referral to Otolaryngology due to progression of notched hearing loss, right ear has had significant change compared to previous test.    43 minutes spent testing and counseling on results.   If you have any questions please feel free to contact me at [phone].  Brendia Sacks, BS [name] Au.D.  Audiologist   10/04/2023  8:07 AM  Cc: Sandford Craze, NP

## 2023-11-28 ENCOUNTER — Other Ambulatory Visit (HOSPITAL_BASED_OUTPATIENT_CLINIC_OR_DEPARTMENT_OTHER): Payer: Self-pay

## 2024-02-22 ENCOUNTER — Other Ambulatory Visit (HOSPITAL_BASED_OUTPATIENT_CLINIC_OR_DEPARTMENT_OTHER): Payer: Self-pay

## 2024-02-22 ENCOUNTER — Ambulatory Visit (INDEPENDENT_AMBULATORY_CARE_PROVIDER_SITE_OTHER): Payer: BC Managed Care – PPO | Admitting: Family

## 2024-02-22 VITALS — BP 118/73 | HR 79 | Temp 97.9°F | Resp 16 | Ht 73.5 in | Wt 240.0 lb

## 2024-02-22 DIAGNOSIS — R5383 Other fatigue: Secondary | ICD-10-CM

## 2024-02-22 DIAGNOSIS — M259 Joint disorder, unspecified: Secondary | ICD-10-CM

## 2024-02-22 DIAGNOSIS — G4733 Obstructive sleep apnea (adult) (pediatric): Secondary | ICD-10-CM

## 2024-02-22 DIAGNOSIS — K219 Gastro-esophageal reflux disease without esophagitis: Secondary | ICD-10-CM

## 2024-02-22 DIAGNOSIS — M25522 Pain in left elbow: Secondary | ICD-10-CM

## 2024-02-22 DIAGNOSIS — E781 Pure hyperglyceridemia: Secondary | ICD-10-CM | POA: Diagnosis not present

## 2024-02-22 DIAGNOSIS — Z87898 Personal history of other specified conditions: Secondary | ICD-10-CM

## 2024-02-22 DIAGNOSIS — F4320 Adjustment disorder, unspecified: Secondary | ICD-10-CM | POA: Diagnosis not present

## 2024-02-22 DIAGNOSIS — H9190 Unspecified hearing loss, unspecified ear: Secondary | ICD-10-CM

## 2024-02-22 DIAGNOSIS — N529 Male erectile dysfunction, unspecified: Secondary | ICD-10-CM

## 2024-02-22 MED ORDER — MELOXICAM 7.5 MG PO TABS
7.5000 mg | ORAL_TABLET | Freq: Every day | ORAL | 0 refills | Status: AC | PRN
  Filled 2024-02-22: qty 30, 30d supply, fill #0

## 2024-02-22 MED ORDER — EPINEPHRINE 0.3 MG/0.3ML IJ SOAJ
0.3000 mg | INTRAMUSCULAR | 1 refills | Status: AC | PRN
  Filled 2024-02-22: qty 2, 30d supply, fill #0

## 2024-02-22 MED ORDER — TADALAFIL 20 MG PO TABS
10.0000 mg | ORAL_TABLET | ORAL | 10 refills | Status: AC | PRN
  Filled 2024-02-22: qty 8, 30d supply, fill #0
  Filled 2024-07-08: qty 8, 30d supply, fill #1
  Filled 2024-10-16: qty 8, 30d supply, fill #2

## 2024-02-22 NOTE — Assessment & Plan Note (Signed)
 Improved. Labs unremarkable last visit.

## 2024-02-22 NOTE — Assessment & Plan Note (Signed)
 No recurrence. Declines referral to allergist for testing.  Update epipen .

## 2024-02-22 NOTE — Assessment & Plan Note (Signed)
 Continues to have occasional left elbow pain- rx sent for prn meloxicam .

## 2024-02-22 NOTE — Assessment & Plan Note (Signed)
 Last lipid panel was improved.  Lab Results  Component Value Date   CHOL 192 08/23/2023   HDL 28.20 (L) 08/23/2023   LDLCALC 137 (H) 08/23/2023   LDLDIRECT 146.0 08/17/2022   TRIG 134.0 08/23/2023   CHOLHDL 7 08/23/2023

## 2024-02-22 NOTE — Progress Notes (Signed)
 Subjective:     Patient ID: James Burton, male    DOB: [birth year], 53 y.o.   MRN: [id number]  Chief Complaint  Patient presents with   Follow-up    Here for 6 months follow up, no new complains    HPI  Discussed the use of AI scribe software for clinical note transcription with the patient, who gave verbal consent to proceed.  History of Present Illness   enton D Halterman is a 52 year old male who presents for a routine follow-up visit.  He continues to take omeprazole  for reflux, with stable symptoms and infrequent need for PRN medications. His previously reported fatigue has resolved. He uses Cialis  as needed for erectile dysfunction, which is effective. He was diagnosed with obstructive sleep apnea in 2018 and was prescribed CPAP, but he did not tolerate the full face mask or nasal pillows. He tried an oral appliance but has not been wearing it. He continues to snore loudly but denies significant morning fatigue or headaches and is not interested in restarting the use of the oral appliance.    Health Maintenance Due  Topic Date Due   Zoster Vaccines- Shingrix (1 of 2) Never done   COVID-19 Vaccine (3 - 2024-25 season) 05/15/2023    Past Medical History:  Diagnosis Date   Allergy    seasonal allergies   Bilateral inguinal hernia    Cervical herniated disc    GERD (gastroesophageal reflux disease)    on meds   OSA (obstructive sleep apnea) 09/25/2014   Moderate OSA per home study 3/16    Plantar fasciitis    hx of   Sleep apnea    use CPAP    Past Surgical History:  Procedure Laterality Date   COLONOSCOPY  02/06/2021   Harry Lindau at [facility]   VASECTOMY  2004   WISDOM TOOTH EXTRACTION      Family History  Problem Relation Age of Onset   Diabetes Father    Diverticulitis Father    Cancer Father        liver   Obesity Sister    Heart attack Maternal Grandmother 54   Cancer Maternal Grandfather        bone, skin   Diabetes type II Paternal Grandmother         died 50   Heart disease Paternal Grandfather    Melanoma Paternal Grandfather    Brain cancer Paternal Grandfather    Colon cancer Neg Hx    Colon polyps Neg Hx    Esophageal cancer Neg Hx    Stomach cancer Neg Hx    Rectal cancer Neg Hx     Social History   Socioeconomic History   Marital status: Married    Spouse name: Not on file   Number of children: Not on file   Years of education: Not on file   Highest education level: Not on file  Occupational History   Occupation: Curator  Tobacco Use   Smoking status: Never   Smokeless tobacco: Never  Vaping Use   Vaping status: Never Used  Substance and Sexual Activity   Alcohol use: No   Drug use: No   Sexual activity: Yes    Partners: Female  Other Topics Concern   Not on file  Social History Narrative   2 boys- 2001 and 1999   Married   Enjoys Photographer   Completed 4 yrs of Engineer, structural schooling   Works as Civil engineer, contracting   Social  Drivers of Corporate investment banker Strain: Not on file  Food Insecurity: Not on file  Transportation Needs: Not on file  Physical Activity: Not on file  Stress: Not on file  Social Connections: Not on file  Intimate Partner Violence: Not on file    Outpatient Medications Prior to Visit  Medication Sig Dispense Refill   omeprazole  (PRILOSEC) 20 MG capsule Take 1 capsule (20 mg total) by mouth daily. 90 capsule 1   cyclobenzaprine  (FLEXERIL ) 10 MG tablet Take 10 mg by mouth 3 (three) times daily as needed for muscle spasms.     EPINEPHrine  0.3 mg/0.3 mL IJ SOAJ injection Inject 0.3 mg into the muscle as needed for anaphylaxis. Use as needed for anaphylaxis, allergic reactions 2 each 1   meloxicam  (MOBIC ) 7.5 MG tablet Take 1 tablet (7.5 mg total) by mouth daily. 14 tablet 0   tadalafil  (CIALIS ) 20 MG tablet Take 1/2 - 1 tablet (10-20 mg total) by mouth every other day as needed for erectile dysfunction. 15 tablet 10   No facility-administered medications prior to  visit.    Allergies  Allergen Reactions   Phenylephrine Hcl (Pressors)     Other reaction(s): rash   Sudafed Pe [Phenylephrine] Other (See Comments)    Rash on palms    ROS See HPI    Objective:     Physical Exam Constitutional:      General: He is not in acute distress.    Appearance: He is well-developed.  HENT:     Head: Normocephalic and atraumatic.  Cardiovascular:     Rate and Rhythm: Normal rate and regular rhythm.     Heart sounds: No murmur heard. Pulmonary:     Effort: Pulmonary effort is normal. No respiratory distress.     Breath sounds: Normal breath sounds. No wheezing or rales.  Skin:    General: Skin is warm and dry.  Neurological:     Mental Status: He is alert and oriented to person, place, and time.  Psychiatric:        Behavior: Behavior normal.        Thought Content: Thought content normal.      BP 118/73 (BP Location: Right Arm, Patient Position: Sitting, Cuff Size: Large)   Pulse 79   Temp 97.9 F (36.6 C) (Oral)   Resp 16   Ht 6' 1.5 (1.867 m)   Wt 240 lb (108.9 kg)   SpO2 97%   BMI 31.23 kg/m  Wt Readings from Last 3 Encounters:  02/22/24 240 lb (108.9 kg)  08/23/23 241 lb (109.3 kg)  02/01/23 237 lb (107.5 kg)       Assessment & Plan:   Problem List Items Addressed This Visit       Unprioritized   OSA (obstructive sleep apnea)    Diagnosed in 2018. Previously prescribed CPAP but did not tolerate the full face mask or nasal pillows. Tried an oral appliance but has not been wearing it. Continues to snore loudly but denies significant morning fatigue or headaches. Not interested in restarting the use of the oral appliance. Discussed the long-term effects of untreated sleep apnea and encouraged reconsideration of treatment options. - Encourage reconsideration of oral appliance use. - If oral appliance is restarted, order a follow-up sleep study to assess effectiveness.       Hypertriglyceridemia   Last lipid panel was  improved.  Lab Results  Component Value Date   CHOL 192 08/23/2023   HDL 28.20 (L) 08/23/2023  LDLCALC 137 (H) 08/23/2023   LDLDIRECT 146.0 08/17/2022   TRIG 134.0 08/23/2023   CHOLHDL 7 08/23/2023         Relevant Medications   EPINEPHrine  0.3 mg/0.3 mL IJ SOAJ injection   tadalafil  (CIALIS ) 20 MG tablet   History of angioedema   No recurrence. Declines referral to allergist for testing.  Update epipen .      Relevant Medications   EPINEPHrine  0.3 mg/0.3 mL IJ SOAJ injection   Hearing disorder    Previously referred to audiology due to hearing issues. Audiology recommended ENT referral due to changes in follow-up studies. He opted not to follow through with ENT referral as he is not currently having any concerns with his hearing.      Grief reaction    Experiencing normal grief following the recent death of his father. Feels he is coping as well as can be expected.      Gastroesophageal reflux disease - Primary   Stable on omeprazole .       Fatigue   Improved. Labs unremarkable last visit.       ED (erectile dysfunction)   Relevant Medications   tadalafil  (CIALIS ) 20 MG tablet   Other Visit Diagnoses       Left elbow pain       Relevant Medications   meloxicam  (MOBIC ) 7.5 MG tablet     Elbow disorder           I have discontinued Brooke D. Sunderlin's cyclobenzaprine . I have also changed his EPINEPHrine , tadalafil , and meloxicam . Additionally, I am having him maintain his omeprazole .  Meds ordered this encounter  Medications   EPINEPHrine  0.3 mg/0.3 mL IJ SOAJ injection    Sig: Inject 0.3 mg into the muscle as needed for anaphylaxis. Use as needed for allergic reactions.    Dispense:  2 each    Refill:  1    Supervising Provider:   Randie Bustle A [[id number]]   tadalafil  (CIALIS ) 20 MG tablet    Sig: Take 0.5-1 tablets (10-20 mg total) by mouth every other day as needed for erectile dysfunction.    Dispense:  15 tablet    Refill:  10    Supervising  Provider:   Randie Bustle A [[id number]]   meloxicam  (MOBIC ) 7.5 MG tablet    Sig: Take 1 tablet (7.5 mg total) by mouth daily as needed for pain.    Dispense:  30 tablet    Refill:  0    Supervising Provider:   Randie Bustle A [[id number]]

## 2024-02-22 NOTE — Assessment & Plan Note (Addendum)
  Experiencing normal grief following the recent death of his father. Feels he is coping as well as can be expected.

## 2024-02-22 NOTE — Assessment & Plan Note (Addendum)
  Diagnosed in 2018. Previously prescribed CPAP but did not tolerate the full face mask or nasal pillows. Tried an oral appliance but has not been wearing it. Continues to snore loudly but denies significant morning fatigue or headaches. Not interested in restarting the use of the oral appliance. Discussed the long-term effects of untreated sleep apnea and encouraged reconsideration of treatment options. - Encourage reconsideration of oral appliance use. - If oral appliance is restarted, order a follow-up sleep study to assess effectiveness.

## 2024-02-22 NOTE — Assessment & Plan Note (Signed)
>>  ASSESSMENT AND PLAN FOR ELBOW DISORDER WRITTEN ON 02/22/2024  9:03 AM BY O'SULLIVAN, Rodolfo Notaro, NP  Continues to have occasional left elbow pain- rx sent for prn meloxicam .

## 2024-02-22 NOTE — Patient Instructions (Signed)
 VISIT SUMMARY:  Today, we reviewed your ongoing health conditions and discussed your current treatments. Your reflux symptoms are stable, and your erectile dysfunction is well-managed with Cialis . We also talked about your obstructive sleep apnea and the importance of reconsidering treatment options. Additionally, we addressed your hearing changes and grief following your father's recent passing.  YOUR PLAN:  OBSTRUCTIVE SLEEP APNEA: You were diagnosed with obstructive sleep apnea in 2018 and have had difficulty tolerating CPAP and oral appliances. You continue to snore but do not experience significant morning fatigue or headaches. -Please reconsider using the oral appliance for your sleep apnea. -If you decide to restart the oral appliance, we will schedule a follow-up sleep study to check its effectiveness.  GASTROESOPHAGEAL REFLUX DISEASE (GERD): Your reflux symptoms are well-managed with omeprazole . -Continue taking omeprazole  as prescribed.  ERECTILE DYSFUNCTION: You use Cialis  as needed, which has been effective for you. -Continue using Cialis  as needed.  HEARING CHANGES: You were previously referred to audiology and recommended to see an ENT due to changes in your hearing studies, but you opted not to follow through as you have no current concerns. -If you experience any changes or concerns with your hearing, please let us  know so we can revisit the ENT referral.  GRIEF: You are experiencing normal grief following the recent death of your father and feel you are coping as well as can be expected. -Continue to take care of yourself and seek support if needed. If you feel overwhelmed, please reach out for additional help.

## 2024-02-22 NOTE — Assessment & Plan Note (Addendum)
  Previously referred to audiology due to hearing issues. Audiology recommended ENT referral due to changes in follow-up studies. He opted not to follow through with ENT referral as he is not currently having any concerns with his hearing.

## 2024-02-22 NOTE — Assessment & Plan Note (Signed)
 Stable on omeprazole.

## 2024-04-05 DIAGNOSIS — L821 Other seborrheic keratosis: Secondary | ICD-10-CM | POA: Diagnosis not present

## 2024-04-05 DIAGNOSIS — L820 Inflamed seborrheic keratosis: Secondary | ICD-10-CM | POA: Diagnosis not present

## 2024-04-05 DIAGNOSIS — D2271 Melanocytic nevi of right lower limb, including hip: Secondary | ICD-10-CM | POA: Diagnosis not present

## 2024-04-05 DIAGNOSIS — L570 Actinic keratosis: Secondary | ICD-10-CM | POA: Diagnosis not present

## 2024-04-05 DIAGNOSIS — D225 Melanocytic nevi of trunk: Secondary | ICD-10-CM | POA: Diagnosis not present

## 2024-07-08 ENCOUNTER — Encounter: Payer: Self-pay | Admitting: Family

## 2024-07-11 ENCOUNTER — Other Ambulatory Visit (HOSPITAL_BASED_OUTPATIENT_CLINIC_OR_DEPARTMENT_OTHER): Payer: Self-pay

## 2024-07-12 ENCOUNTER — Other Ambulatory Visit (HOSPITAL_BASED_OUTPATIENT_CLINIC_OR_DEPARTMENT_OTHER): Payer: Self-pay

## 2024-08-29 ENCOUNTER — Ambulatory Visit: Admitting: Family

## 2024-09-03 ENCOUNTER — Ambulatory Visit: Admitting: Family

## 2024-09-03 ENCOUNTER — Encounter: Payer: Self-pay | Admitting: Family

## 2024-09-03 VITALS — BP 123/79 | HR 71 | Temp 97.9°F | Resp 16 | Ht 73.5 in | Wt 240.0 lb

## 2024-09-03 DIAGNOSIS — K219 Gastro-esophageal reflux disease without esophagitis: Secondary | ICD-10-CM

## 2024-09-03 DIAGNOSIS — E66811 Obesity, class 1: Secondary | ICD-10-CM | POA: Insufficient documentation

## 2024-09-03 DIAGNOSIS — N529 Male erectile dysfunction, unspecified: Secondary | ICD-10-CM | POA: Diagnosis not present

## 2024-09-03 DIAGNOSIS — M25522 Pain in left elbow: Secondary | ICD-10-CM

## 2024-09-03 DIAGNOSIS — E6609 Other obesity due to excess calories: Secondary | ICD-10-CM

## 2024-09-03 DIAGNOSIS — F4320 Adjustment disorder, unspecified: Secondary | ICD-10-CM | POA: Diagnosis not present

## 2024-09-03 DIAGNOSIS — Z6831 Body mass index (BMI) 31.0-31.9, adult: Secondary | ICD-10-CM | POA: Diagnosis not present

## 2024-09-03 DIAGNOSIS — Z23 Encounter for immunization: Secondary | ICD-10-CM

## 2024-09-03 DIAGNOSIS — G4733 Obstructive sleep apnea (adult) (pediatric): Secondary | ICD-10-CM | POA: Diagnosis not present

## 2024-09-03 MED ORDER — ZEPBOUND 2.5 MG/0.5ML ~~LOC~~ SOAJ: 2.5000 mg | SUBCUTANEOUS | 0 refills | Status: AC

## 2024-09-03 NOTE — Patient Instructions (Signed)
" °  VISIT SUMMARY: Today, we discussed your ongoing health concerns, including sleep apnea, weight management, reflux, erectile dysfunction, left elbow arthritis, and grief. We also talked about the potential for an ADHD evaluation and the importance of flu vaccination.  YOUR PLAN: -OBSTRUCTIVE SLEEP APNEA: Obstructive sleep apnea is a condition where your breathing stops and starts during sleep. We are starting the process to get approval for a medication called tirzepatide , which may help with weight loss and improve your sleep apnea. If approved, you will start with a dose of 2.5 mg weekly, and we will adjust the dose based on how you tolerate it and your weight loss progress. We will check in again in three months to see how you are doing.  -CLASS 1 OBESITY: Class 1 obesity means your body mass index (BMI) is between 30 and 34.9, which can contribute to other health issues like sleep apnea. We are starting the process to get approval for tirzepatide , which may help with weight loss. If approved, you will start with a dose of 2.5 mg weekly, and we will adjust the dose based on how you tolerate it and your weight loss progress. We will check in again in three months to see how you are doing.  -GASTROESOPHAGEAL REFLUX DISEASE: Gastroesophageal reflux disease (GERD) is a condition where stomach acid frequently flows back into the tube connecting your mouth and stomach. Continue with your dietary modifications to manage your symptoms. If you start tirzepatide , monitor for any increase in reflux symptoms, and we may need to adjust your omeprazole  dosage.  -ERECTILE DYSFUNCTION: Erectile dysfunction is the inability to get or keep an erection firm enough for sex. Continue taking tadalafil  (Cialis ) as needed. If you notice any changes in its effectiveness, let us  know.  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 You can use Voltaren gel for  any discomfort.  -GRIEF REACTION: You are experiencing a grief reaction following your father's death, which is improving but still present at times. Continue with supportive care and give yourself time to heal.  -GENERAL HEALTH MAINTENANCE: We discussed the importance of getting a flu vaccine due to the recent increase in flu cases. Please get the flu vaccine when it is available.  INSTRUCTIONS: We will reassess your progress with tirzepatide  in three months. Please monitor for any side effects and let us  know if you experience any changes in your symptoms. Also, remember to get your flu vaccine when it is available.                                           "

## 2024-09-03 NOTE — Assessment & Plan Note (Signed)
" °  Chronic arthritis with limited range of motion. No significant pain but functional limitation persists. - Consider using Voltaren gel for symptomatic relief. "

## 2024-09-03 NOTE — Assessment & Plan Note (Signed)
" °  Chronic condition managed with dietary modifications. Potential need for increased omeprazole  with tirzepatide  due to slowed gastric emptying. - Continue dietary modifications. - Monitor for increased reflux symptoms with tirzepatide  and adjust omeprazole  as needed. "

## 2024-09-03 NOTE — Assessment & Plan Note (Signed)
" °  Chronic erectile dysfunction managed with tadalafil . Variable effectiveness possibly related to psychological factors. - Continue tadalafil  as needed. "

## 2024-09-03 NOTE — Progress Notes (Signed)
 "  Subjective:     Patient ID: James Burton, male    DOB: [birth year], 53 y.o.   MRN: [id number]  Chief Complaint  Patient presents with   Gastroesophageal Reflux    Here for follow up    Gastroesophageal Reflux    Discussed the use of AI scribe software for clinical note transcription with the patient, who gave verbal consent to proceed.  History of Present Illness James Burton is a 53 year old male who presents for a follow-up on medications and discussion of potential ADHD evaluation.  He often starts projects but does not finish them. He has not had a formal ADHD evaluation in the past, though he underwent sleep deprivation tests as a child. He is hesitant about starting medication due to concerns about side effects.  He experiences ongoing issues with his left elbow, which he cannot straighten. He was previously told by clinicians that his inability to straighten his left elbow is due to arthritis, and he mentions that it does not cause pain but is bothersome. He has been doing more physical work, including remodeling a house, which involves painting and overhead work.  He has a CPAP machine for sleep apnea but uses it infrequently, about three times a year, as he finds it uncomfortable. His partner, James Burton, observes that he snores less when he loses weight. He experiences morning swelling of the uvula.  He experiences reflux, which he manages by avoiding certain foods like pork, barbecue, and sausage. He does not regularly take medication for it but is mindful of his diet to prevent symptoms.  He uses Cialis  as needed and finds it helpful, taking half a pill, which lasts for two to three days. He believes some of his issues may be mental.  He feels more easily winded and attributes this to a lack of physical activity. He has not had a flu shot this year.      Health Maintenance Due  Topic Date Due   Hepatitis B Vaccines 19-59 Average Risk (1 of 3 - 19+ 3-dose series)  Never done   Pneumococcal Vaccine: 50+ Years (1 of 1 - PCV) Never done   Zoster Vaccines- Shingrix (1 of 2) Never done   COVID-19 Vaccine (3 - 2025-26 season) 05/14/2024    Past Medical History:  Diagnosis Date   Allergy    seasonal allergies   Bilateral inguinal hernia    Cervical herniated disc    GERD (gastroesophageal reflux disease)    on meds   OSA (obstructive sleep apnea) 09/25/2014   Moderate OSA per home study 3/16    Plantar fasciitis    hx of   Sleep apnea    use CPAP    Past Surgical History:  Procedure Laterality Date   COLONOSCOPY  02/06/2021   Sandor Flatter at [facility]   VASECTOMY  2004   WISDOM TOOTH EXTRACTION      Family History  Problem Relation Age of Onset   Diabetes Father    Diverticulitis Father    Cancer Father        liver   Obesity Sister    Heart attack Maternal Grandmother 34   Cancer Maternal Grandfather        bone, skin   Diabetes type II Paternal Grandmother        died 27   Heart disease Paternal Grandfather    Melanoma Paternal Grandfather    Brain cancer Paternal Grandfather    Colon cancer Neg Hx  Colon polyps Neg Hx    Esophageal cancer Neg Hx    Stomach cancer Neg Hx    Rectal cancer Neg Hx     Social History   Socioeconomic History   Marital status: Married    Spouse name: Not on file   Number of children: Not on file   Years of education: Not on file   Highest education level: Not on file  Occupational History   Occupation: curator  Tobacco Use   Smoking status: Never   Smokeless tobacco: Never  Vaping Use   Vaping status: Never Used  Substance and Sexual Activity   Alcohol use: No   Drug use: No   Sexual activity: Yes    Partners: Female  Other Topics Concern   Not on file  Social History Narrative   2 boys- 2001 and 1999   Married   Enjoys photographer   Completed 4 yrs of engineer, structural schooling   Works as civil engineer, contracting   Social Drivers of Health   Tobacco Use: Low Risk (09/03/2024)    Patient History    Smoking Tobacco Use: Never    Smokeless Tobacco Use: Never    Passive Exposure: Not on file  Financial Resource Strain: Not on file  Food Insecurity: Not on file  Transportation Needs: Not on file  Physical Activity: Not on file  Stress: Not on file  Social Connections: Not on file  Intimate Partner Violence: Not on file  Depression (PHQ2-9): Low Risk (09/03/2024)   Depression (PHQ2-9)    PHQ-2 Score: 2  Alcohol Screen: Not on file  Housing: Not on file  Utilities: Not on file  Health Literacy: Not on file    Outpatient Medications Prior to Visit  Medication Sig Dispense Refill   EPINEPHrine  0.3 mg/0.3 mL IJ SOAJ injection Inject 0.3 mg into the muscle as needed for anaphylaxis. Use as needed for allergic reactions. 2 each 1   meloxicam  (MOBIC ) 7.5 MG tablet Take 1 tablet (7.5 mg total) by mouth daily as needed for pain. 30 tablet 0   omeprazole  (PRILOSEC) 20 MG capsule Take 1 capsule (20 mg total) by mouth daily. 90 capsule 1   tadalafil  (CIALIS ) 20 MG tablet Take 0.5-1 tablets (10-20 mg total) by mouth every other day as needed for erectile dysfunction. 15 tablet 10   No facility-administered medications prior to visit.    Allergies[1]  ROS See HPI    Objective:    Physical Exam Constitutional:      General: He is not in acute distress.    Appearance: He is well-developed.  HENT:     Head: Normocephalic and atraumatic.  Cardiovascular:     Rate and Rhythm: Normal rate and regular rhythm.     Heart sounds: No murmur heard. Pulmonary:     Effort: Pulmonary effort is normal. No respiratory distress.     Breath sounds: Normal breath sounds. No wheezing or rales.  Skin:    General: Skin is warm and dry.  Neurological:     Mental Status: He is alert and oriented to person, place, and time.  Psychiatric:        Behavior: Behavior normal.        Thought Content: Thought content normal.      BP 123/79 (BP Location: Right Arm, Patient Position:  Sitting, Cuff Size: Large)   Pulse 71   Temp 97.9 F (36.6 C) (Oral)   Resp 16   Ht 6' 1.5 (1.867 m)   Wt  240 lb (108.9 kg)   SpO2 98%   BMI 31.23 kg/m  Wt Readings from Last 3 Encounters:  09/03/24 240 lb (108.9 kg)  02/22/24 240 lb (108.9 kg)  08/23/23 241 lb (109.3 kg)       Assessment & Plan:   Problem List Items Addressed This Visit       Unprioritized   Pain in left elbow    Chronic arthritis with limited range of motion. No significant pain but functional limitation persists. - Consider using Voltaren gel for symptomatic relief.      OSA (obstructive sleep apnea) - Primary    Chronic obstructive sleep apnea with poor CPAP adherence. Potential benefit from tirzepatide  for weight loss and sleep apnea management. - Initiated prior authorization for tirzepatide . - Start tirzepatide  at 2.5 mg weekly if approved, with dose escalation based on tolerance and weight loss. - Monitor for side effects and adjust dose as needed. - Reassess in three months.       Relevant Medications   tirzepatide  (ZEPBOUND ) 2.5 MG/0.5ML Pen   Grief reaction    Ongoing grief reaction following father's death. Symptoms improving but still present intermittently. - Continue supportive care.      Gastroesophageal reflux disease    Chronic condition managed with dietary modifications. Potential need for increased omeprazole  with tirzepatide  due to slowed gastric emptying. - Continue dietary modifications. - Monitor for increased reflux symptoms with tirzepatide  and adjust omeprazole  as needed.      ED (erectile dysfunction)    Chronic erectile dysfunction managed with tadalafil . Variable effectiveness possibly related to psychological factors. - Continue tadalafil  as needed.      Class 1 obesity due to excess calories with serious comorbidity and body mass index (BMI) of 31.0 to 31.9 in adult    Contributing to obstructive sleep apnea. Discussed tirzepatide  for weight loss and its  impact on overall health. - Initiated prior authorization for tirzepatide . - Start tirzepatide  at 2.5 mg weekly if approved, with dose escalation based on tolerance and weight loss. - Monitor for side effects and adjust dose as needed. - Reassess in three months.      Relevant Medications   tirzepatide  (ZEPBOUND ) 2.5 MG/0.5ML Pen   Other Visit Diagnoses       Needs flu shot       Relevant Orders   Flu vaccine trivalent PF, 6mos and older(Flulaval,Afluria,Fluarix,Fluzone) (Completed)       Assessment & Plan     I am having Jyquan D. Berquist start on Zepbound . I am also having him maintain his omeprazole , EPINEPHrine , tadalafil , and meloxicam .  Meds ordered this encounter  Medications   tirzepatide  (ZEPBOUND ) 2.5 MG/0.5ML Pen    Sig: Inject 2.5 mg into the skin once a week.    Dispense:  2 mL    Refill:  0    Supervising Provider:   DOMENICA BLACKBIRD A [[id number]]      [1]  Allergies Allergen Reactions   Phenylephrine Hcl (Pressors)     Other reaction(s): rash   Sudafed Pe [Phenylephrine] Other (See Comments)    Rash on palms   "

## 2024-09-03 NOTE — Assessment & Plan Note (Signed)
" °  Contributing to obstructive sleep apnea. Discussed tirzepatide  for weight loss and its impact on overall health. - Initiated prior authorization for tirzepatide . - Start tirzepatide  at 2.5 mg weekly if approved, with dose escalation based on tolerance and weight loss. - Monitor for side effects and adjust dose as needed. - Reassess in three months. "

## 2024-09-03 NOTE — Assessment & Plan Note (Signed)
" °  Ongoing grief reaction following father's death. Symptoms improving but still present intermittently. - Continue supportive care. "

## 2024-09-03 NOTE — Assessment & Plan Note (Signed)
" °  Chronic obstructive sleep apnea with poor CPAP adherence. Potential benefit from tirzepatide  for weight loss and sleep apnea management. - Initiated prior authorization for tirzepatide . - Start tirzepatide  at 2.5 mg weekly if approved, with dose escalation based on tolerance and weight loss. - Monitor for side effects and adjust dose as needed. - Reassess in three months.  "

## 2024-09-22 ENCOUNTER — Encounter: Payer: Self-pay | Admitting: Family

## 2024-09-23 NOTE — Telephone Encounter (Signed)
 Could you please check with pharmacy to see if Zepbound  needs prior auth or if it is just not covered at all?  If it does need prior auth, can you please reach out to our PA team to notify them?

## 2024-11-13 ENCOUNTER — Encounter: Admitting: Family
